# Patient Record
Sex: Male | Born: 1990 | Race: White | Hispanic: No | Marital: Single | Smoking: Never smoker
Health system: Southern US, Community
[De-identification: ages and names within clinical notes are randomized; demographics above are authoritative.]

## PROBLEM LIST (undated history)

## (undated) DIAGNOSIS — F3160 Bipolar disorder, current episode mixed, unspecified: Secondary | ICD-10-CM

## (undated) DIAGNOSIS — F32A Depression, unspecified: Secondary | ICD-10-CM

## (undated) DIAGNOSIS — G479 Sleep disorder, unspecified: Secondary | ICD-10-CM

## (undated) DIAGNOSIS — J45909 Unspecified asthma, uncomplicated: Secondary | ICD-10-CM

## (undated) DIAGNOSIS — F909 Attention-deficit hyperactivity disorder, unspecified type: Secondary | ICD-10-CM

## (undated) DIAGNOSIS — F329 Major depressive disorder, single episode, unspecified: Secondary | ICD-10-CM

## (undated) DIAGNOSIS — F419 Anxiety disorder, unspecified: Secondary | ICD-10-CM

## (undated) HISTORY — PX: WISDOM TOOTH EXTRACTION: SHX21

## (undated) HISTORY — DX: Unspecified asthma, uncomplicated: J45.909

## (undated) HISTORY — DX: Major depressive disorder, single episode, unspecified: F32.9

## (undated) HISTORY — DX: Bipolar disorder, current episode mixed, unspecified: F31.60

## (undated) HISTORY — DX: Attention-deficit hyperactivity disorder, unspecified type: F90.9

## (undated) HISTORY — DX: Depression, unspecified: F32.A

## (undated) HISTORY — DX: Sleep disorder, unspecified: G47.9

## (undated) HISTORY — DX: Anxiety disorder, unspecified: F41.9

## (undated) HISTORY — PX: ESOPHAGOGASTRODUODENOSCOPY ENDOSCOPY: SHX5814

---

## 2014-06-01 DIAGNOSIS — R350 Frequency of micturition: Secondary | ICD-10-CM | POA: Insufficient documentation

## 2016-04-03 ENCOUNTER — Encounter: Payer: Self-pay | Admitting: Family Medicine

## 2016-04-03 ENCOUNTER — Ambulatory Visit (INDEPENDENT_AMBULATORY_CARE_PROVIDER_SITE_OTHER): Payer: BLUE CROSS/BLUE SHIELD | Admitting: Family Medicine

## 2016-04-03 VITALS — BP 112/68 | HR 88 | Ht 69.25 in | Wt 155.6 lb

## 2016-04-03 DIAGNOSIS — F909 Attention-deficit hyperactivity disorder, unspecified type: Secondary | ICD-10-CM

## 2016-04-03 DIAGNOSIS — R51 Headache: Secondary | ICD-10-CM

## 2016-04-03 DIAGNOSIS — F3160 Bipolar disorder, current episode mixed, unspecified: Secondary | ICD-10-CM

## 2016-04-03 DIAGNOSIS — F908 Attention-deficit hyperactivity disorder, other type: Secondary | ICD-10-CM

## 2016-04-03 DIAGNOSIS — Z818 Family history of other mental and behavioral disorders: Secondary | ICD-10-CM

## 2016-04-03 DIAGNOSIS — G8929 Other chronic pain: Secondary | ICD-10-CM | POA: Insufficient documentation

## 2016-04-03 DIAGNOSIS — G479 Sleep disorder, unspecified: Secondary | ICD-10-CM

## 2016-04-03 DIAGNOSIS — D170 Benign lipomatous neoplasm of skin and subcutaneous tissue of head, face and neck: Secondary | ICD-10-CM

## 2016-04-03 DIAGNOSIS — R519 Headache, unspecified: Secondary | ICD-10-CM | POA: Insufficient documentation

## 2016-04-03 DIAGNOSIS — Z820 Family history of epilepsy and other diseases of the nervous system: Secondary | ICD-10-CM

## 2016-04-03 DIAGNOSIS — G44229 Chronic tension-type headache, not intractable: Secondary | ICD-10-CM

## 2016-04-03 DIAGNOSIS — Z8042 Family history of malignant neoplasm of prostate: Secondary | ICD-10-CM | POA: Insufficient documentation

## 2016-04-03 DIAGNOSIS — F900 Attention-deficit hyperactivity disorder, predominantly inattentive type: Secondary | ICD-10-CM | POA: Insufficient documentation

## 2016-04-03 HISTORY — DX: Bipolar disorder, current episode mixed, unspecified: F31.60

## 2016-04-03 HISTORY — DX: Attention-deficit hyperactivity disorder, unspecified type: F90.9

## 2016-04-03 HISTORY — DX: Sleep disorder, unspecified: G47.9

## 2016-04-03 NOTE — Assessment & Plan Note (Signed)
Being seen and treated by Dr Graylon Gunning a psychiatrist, - in [place], [place].  Told patient I'm not comfortable treating his condition with his current medication regimen and I recommend he continue with specialists.

## 2016-04-03 NOTE — Assessment & Plan Note (Signed)
Saw a Dermatologist- in [place] for his L sided Forehead lipoma.  He is opting to not excise at this time.  Told pt I am not comfortable cutting out lesion on his face, but can recommend him to another doc for this when ready

## 2016-04-03 NOTE — Assessment & Plan Note (Signed)
seroquel per Psychiatrist

## 2016-04-03 NOTE — Patient Instructions (Addendum)
please get me your medical records from your recent blood work from your other doctor and then we will determine what additional blood work may be needed if any once reviewed.           Mediterranean Diet  Why follow it? Research shows. . Those who follow the Mediterranean diet have a reduced risk of heart disease  . The diet is associated with a reduced incidence of Parkinson's and Alzheimer's diseases . People following the diet may have longer life expectancies and lower rates of chronic diseases  . The Dietary Guidelines for Americans recommends the Mediterranean diet as an eating plan to promote health and prevent disease  What Is the Mediterranean Diet?  . Healthy eating plan based on typical foods and recipes of Mediterranean-style cooking . The diet is primarily a plant based diet; these foods should make up a majority of meals   Starches - Plant based foods should make up a majority of meals - They are an important sources of vitamins, minerals, energy, antioxidants, and fiber - Choose whole grains, foods high in fiber and minimally processed items  - Typical grain sources include wheat, oats, barley, corn, brown rice, bulgar, farro, millet, polenta, couscous  - Various types of beans include chickpeas, lentils, fava beans, black beans, white beans   Fruits  Veggies - Large quantities of antioxidant rich fruits & veggies; 6 or more servings  - Vegetables can be eaten raw or lightly drizzled with oil and cooked  - Vegetables common to the traditional Mediterranean Diet include: artichokes, arugula, beets, broccoli, brussel sprouts, cabbage, carrots, celery, collard greens, cucumbers, eggplant, kale, leeks, lemons, lettuce, mushrooms, okra, onions, peas, peppers, potatoes, pumpkin, radishes, rutabaga, shallots, spinach, sweet potatoes, turnips, zucchini - Fruits common to the Mediterranean Diet include: apples, apricots, avocados, cherries, clementines, dates, figs, grapefruits,  grapes, melons, nectarines, oranges, peaches, pears, pomegranates, strawberries, tangerines  Fats - Replace butter and margarine with healthy oils, such as olive oil, canola oil, and tahini  - Limit nuts to no more than a handful a day  - Nuts include walnuts, almonds, pecans, pistachios, pine nuts  - Limit or avoid candied, honey roasted or heavily salted nuts - Olives are central to the [employer] - can be eaten whole or used in a variety of dishes   Meats Protein - Limiting red meat: no more than a few times a month - When eating red meat: choose lean cuts and keep the portion to the size of deck of cards - Eggs: approx. 0 to 4 times a week  - Fish and lean poultry: at least 2 a week  - Healthy protein sources include, chicken, [place], lean beef, lamb - Increase intake of seafood such as tuna, salmon, trout, mackerel, shrimp, scallops - Avoid or limit high fat processed meats such as sausage and bacon  Dairy - Include moderate amounts of low fat dairy products  - Focus on healthy dairy such as fat free yogurt, skim milk, low or reduced fat cheese - Limit dairy products higher in fat such as whole or 2% milk, cheese, ice cream  Alcohol - Moderate amounts of red wine is ok  - No more than 5 oz daily for women (all ages) and men older than age 59  - No more than 10 oz of wine daily for men younger than 73  Other - Limit sweets and other desserts  - Use herbs and spices instead of salt to flavor foods  - Herbs and  spices common to the traditional Mediterranean Diet include: basil, bay leaves, chives, cloves, cumin, fennel, garlic, lavender, marjoram, mint, oregano, parsley, pepper, rosemary, sage, savory, sumac, tarragon, thyme   It's not just a diet, it's a lifestyle:  . The Mediterranean diet includes lifestyle factors typical of those in the region  . Foods, drinks and meals are best eaten with others and savored . Daily physical activity is important for overall good  health . This could be strenuous exercise like running and aerobics . This could also be more leisurely activities such as walking, housework, yard-work, or taking the stairs . Moderation is the key; a balanced and healthy diet accommodates most foods and drinks . Consider portion sizes and frequency of consumption of certain foods   Meal Ideas & Options:  . Breakfast:  o Whole wheat toast or whole wheat English muffins with peanut butter & hard boiled egg o Steel cut oats topped with apples & cinnamon and skim milk  o Fresh fruit: banana, strawberries, melon, berries, peaches  o Smoothies: strawberries, bananas, greek yogurt, peanut butter o Low fat greek yogurt with blueberries and granola  o Egg white omelet with spinach and mushrooms o Breakfast couscous: whole wheat couscous, apricots, skim milk, cranberries  . Sandwiches:  o Hummus and grilled vegetables (peppers, zucchini, squash) on whole wheat bread   o Grilled chicken on whole wheat pita with lettuce, tomatoes, cucumbers or tzatziki  o Tuna salad on whole wheat bread: tuna salad made with greek yogurt, olives, red peppers, capers, green onions o Garlic rosemary lamb pita: lamb sauted with garlic, rosemary, salt & pepper; add lettuce, cucumber, greek yogurt to pita - flavor with lemon juice and black pepper  . Seafood:  o Mediterranean grilled salmon, seasoned with garlic, basil, parsley, lemon juice and black pepper o Shrimp, lemon, and spinach whole-grain pasta salad made with low fat greek yogurt  o Seared scallops with lemon orzo  o Seared tuna steaks seasoned salt, pepper, coriander topped with tomato mixture of olives, tomatoes, olive oil, minced garlic, parsley, green onions and cappers  . Meats:  o Herbed greek chicken salad with kalamata olives, cucumber, feta  o Red bell peppers stuffed with spinach, bulgur, lean ground beef (or lentils) & topped with feta   o Kebabs: skewers of chicken, tomatoes, onions, zucchini,  squash  o [place] burgers: made with red onions, mint, dill, lemon juice, feta cheese topped with roasted red peppers . Vegetarian o Cucumber salad: cucumbers, artichoke hearts, celery, red onion, feta cheese, tossed in olive oil & lemon juice  o Hummus and whole grain pita points with a greek salad (lettuce, tomato, feta, olives, cucumbers, red onion) o Lentil soup with celery, carrots made with vegetable broth, garlic, salt and pepper  o Tabouli salad: parsley, bulgur, mint, scallions, cucumbers, tomato, radishes, lemon juice, olive oil, salt and pepper.    Top Ten Foods for Health  1. Water Drink at least 8 to 12 cups of water daily. Consume half of your body weight in pounds, is the amount of water in ounces to drink daily.  Ie: a 200lb person = 100 oz water daily  2. Dark Green Vegetables Eat dark green vegetables at least three to four times a week. Good options include broccoli, peppers, brussel sprouts and leafy greens like kale and spinach.  3. Whole Grains Whole grains should be included in your diet at least two to three times daily. Look for whole wheat flour, rye, oatmeal, barley, amaranth, quinoa or  a multigrain. A good source of fiber includes 3 to 4 grams of fiber per serving. A great source has 5 or more grams of fiber per serving.  4. Beans and Lentils Try to eat a bean-based meal at least once a week. Try to add legumes, including beans and lentils, to soups, stews, casseroles, salads and dips or eat them plain.  5. Fish Try to eat two to three serving of fish a week. A serving consists of 3 to 4 ounces of cooked fish. Good choices are salmon, trout, herring, bluefish, sardines and tuna.  6. Berries Include two to four servings of fruit in your diet each day. Try to eat berries such as raspberries, blueberries, blackberries and strawberries.  7. Winter Squash Eat butternut and acorn squash as well as other richly pigmented dark orange and green colored vegetables like  sweet potato, cantaloupe and mango.  8. Soy 25 grams of soy protein a day is recommended as part of a low-fat diet to help lower cholesterol levels. Try tofu, soymilk, edamame soybeans, tempeh and texturized vegetable protein (TVP).  9. Flaxseed, Nuts and Seeds Add 1 to 2 tablespoons of ground flaxseed or other seeds to food each day or include a moderate amount of nuts - 1/4 cup - in your daily diet.  10. Organic Yogurt Men and women between 16 and 39 years of age need 1000 milligrams of calcium a day and 1200 milligrams if 85 or older. Eat calcium-rich foods such as nonfat or low-fat dairy products three to four times a day. Include organic choices.

## 2016-04-03 NOTE — Assessment & Plan Note (Addendum)
Patient getting his antipsychotics from his psychiatrist so he will just continue to get his ADHD meds from them as well.

## 2016-04-03 NOTE — Progress Notes (Signed)
New patient office visit note:  Impression and Recommendations:    Lipoma of forehead Saw a Dermatologist- in [place] for his L sided Forehead lipoma.  He is opting to not excise at this time.  Told pt I am not comfortable cutting out lesion on his face, but can recommend him to another doc for this when ready  Sleep disorder seroquel per Psychiatrist  Bipolar 1 disorder, mixed ([place]) Being seen and treated by Dr Graylon Gunning a psychiatrist, - in [place], [place].  Told patient I'm not comfortable treating his condition with his current medication regimen and I recommend he continue with specialists.  Attention deficit hyperactivity disorder (ADHD) Patient getting his antipsychotics from his psychiatrist so he will just continue to get his ADHD meds from them as well.  Pt will get me medical records from his other docs and f-up 3 months to review those and decide on further screening tests at that time.      per patient his psychiatrist just took blood work couple weeks ago and once I get that, we will determine what other blood work if any patient still needs.  Advised yrly health maintenance examination in future. We can obtain blood work at that time.  Patient's Medications  New Prescriptions   No medications on file  Previous Medications   CLONAZEPAM (KLONOPIN) 0.5 MG TABLET    Take 0.5 mg by mouth 2 (two) times daily as needed for anxiety.   LAMOTRIGINE (LAMICTAL) 100 MG TABLET    Take 150 mg by mouth daily.   QUETIAPINE (SEROQUEL) 25 MG TABLET    Take 12.5 mg by mouth at bedtime.   VYVANSE 20 MG CAPSULE    Take 20 mg by mouth every morning.  Modified Medications   No medications on file  Discontinued Medications   No medications on file    Return in about 3 months (around 07/04/2016).  The patient was counseled, risk factors were discussed, anticipatory guidance given.  Gross side effects, risk and benefits, and alternatives of medications discussed with patient.   Patient is aware that all medications have potential side effects and we are unable to predict every side effect or drug-drug interaction that may occur.  Expresses verbal understanding and consents to current therapy plan and treatment regimen.  Please see AVS handed out to patient at the end of our visit for further patient instructions/ counseling done pertaining to today's office visit.    Note: This document was prepared using Dragon voice recognition software and may include unintentional dictation errors.  ----------------------------------------------------------------------------------------------------------------------    Subjective:    Chief Complaint  Patient presents with  . Establish Care    HPI: Brent Brooks is a pleasant 25 y.o. male who presents to [place] at [facility] today to review his medical history with me and establish care.   Pt lives with Mother and step brother.   4 yr degree at [employer], minor math.  Doesn't work currently. Single.  Not sexually active- women only prior.  One cup of coffee per day. He does not exercise and a low desire to start. He has never smoked and does not drink alcohol. Denies a history of recreational drug use  Complicated psychiatric history which patient is apprehensive today about sharing with me the details of.   Denies SI/ HI.    Patient Active Problem List   Diagnosis Date Noted  . Bipolar 1 disorder, mixed ([place]) 04/03/2016  .  Sleep disorder 04/03/2016  . Lipoma of forehead 04/03/2016  . Attention deficit hyperactivity disorder (ADHD) 04/03/2016  . Family history of prostate cancer- [facility]GF 04/03/2016  . Family history of depression in [facility], [facility] 04/03/2016  . Family history of suicide [facility] 04/03/2016  . FH: migraines- multiple relatives 04/03/2016     Past Medical History:  Diagnosis Date  . Anxiety   . Asthma   . Attention deficit hyperactivity disorder (ADHD) 04/03/2016  . Bipolar 1  disorder, mixed ([place]) 04/03/2016   Dr Graylon Gunning- in [place], [place]  . Depression   . Sleep disorder 04/03/2016     Past Surgical History:  Procedure Laterality Date  . WISDOM TOOTH EXTRACTION       Family History  Problem Relation Age of Onset  . Depression Mother   . Migraines Mother   . Migraines Brother   . Suicidality Maternal Grandmother   . Migraines Maternal Grandmother   . Cancer Maternal Grandfather     prostate  . Cancer Paternal Grandfather     unknown     History  Drug Use No    History  Alcohol Use No    History  Smoking Status  . Never Smoker  Smokeless Tobacco  . Never Used    Patient's Medications  New Prescriptions   No medications on file  Previous Medications   CLONAZEPAM (KLONOPIN) 0.5 MG TABLET    Take 0.5 mg by mouth 2 (two) times daily as needed for anxiety.   LAMOTRIGINE (LAMICTAL) 100 MG TABLET    Take 150 mg by mouth daily.   QUETIAPINE (SEROQUEL) 25 MG TABLET    Take 12.5 mg by mouth at bedtime.   VYVANSE 20 MG CAPSULE    Take 20 mg by mouth every morning.  Modified Medications   No medications on file  Discontinued Medications   No medications on file    Allergies: Review of patient's allergies indicates no known allergies.  Review of Systems  Constitutional: Positive for malaise/fatigue. Negative for diaphoresis, fever and weight loss.       Patient states weakness/ tired due to poor sleep habits and he feels fatigued chronically  HENT:       Has chronic headaches.  Eyes: Negative for blurred vision, double vision, pain and discharge.  Respiratory: Negative for cough and wheezing.   Cardiovascular: Negative for chest pain and palpitations.  Gastrointestinal: Positive for diarrhea, nausea and vomiting. Negative for blood in stool.       Gets GI distress when he gets emotionally upset/ IBS type sx- chronic.   Genitourinary: Negative for dysuria, frequency and urgency.  Musculoskeletal: Negative for joint pain and myalgias.    Skin: Negative.  Negative for itching and rash.  Neurological: Positive for weakness and headaches. Negative for dizziness and focal weakness.  Endo/Heme/Allergies: Does not bruise/bleed easily.  Psychiatric/Behavioral: Positive for depression. Negative for hallucinations, memory loss, substance abuse and suicidal ideas. The patient is nervous/anxious and has insomnia.        Symptoms are chronic     Objective:   Blood pressure 125/81, pulse 88, height 5' 9.25" (1.759 m), weight 155 lb 9.6 oz (70.6 kg). Body mass index is 22.81 kg/m.  General: Well Developed, well nourished, and in no acute distress.  Neuro: Alert and oriented x3, extra-ocular muscles intact, sensation grossly intact.  HEENT: Normocephalic, atraumatic, pupils equal round reactive to light, neck supple Skin: no gross rashes, + soft, flesh-colored, freely small mass approx 4cm diam L FH over L eyebrow.  Cardiac: Regular rate and rhythm, no murmurs rubs or gallops.  Respiratory: Essentially clear to auscultation bilaterally. Not using accessory muscles, speaking in full sentences.  Abdominal: Soft, not grossly distended Musculoskeletal: Ambulates w/o diff, FROM * 4 ext.  Vasc: less 2 sec cap RF, warm and pink  Psych:  No HI/SI, judgement and insight appropriate.

## 2016-07-05 ENCOUNTER — Encounter: Payer: Self-pay | Admitting: Family Medicine

## 2016-07-05 ENCOUNTER — Ambulatory Visit (INDEPENDENT_AMBULATORY_CARE_PROVIDER_SITE_OTHER): Payer: PRIVATE HEALTH INSURANCE | Admitting: Family Medicine

## 2016-07-05 DIAGNOSIS — F430 Acute stress reaction: Secondary | ICD-10-CM

## 2016-07-05 DIAGNOSIS — R142 Eructation: Secondary | ICD-10-CM

## 2016-07-05 DIAGNOSIS — R143 Flatulence: Secondary | ICD-10-CM | POA: Diagnosis not present

## 2016-07-05 DIAGNOSIS — R110 Nausea: Secondary | ICD-10-CM | POA: Diagnosis not present

## 2016-07-05 DIAGNOSIS — R141 Gas pain: Secondary | ICD-10-CM

## 2016-07-05 MED ORDER — OMEPRAZOLE 20 MG PO CPDR: DELAYED_RELEASE_CAPSULE | ORAL | Status: DC

## 2016-07-05 MED ORDER — RANITIDINE HCL 150 MG PO TABS: 300.0000 mg | ORAL_TABLET | Freq: Two times a day (BID) | ORAL | Status: DC

## 2016-07-05 NOTE — Patient Instructions (Addendum)
Bland diet for at least 2-3 weeks and then continued to at least 5 days after symptoms completely resolve   F/up with your psychiatrist.    TREATMENT  This illness typically goes away on its own. Treatments are aimed at rehydration. The most serious cases of viral gastroenteritis involve vomiting so severely that you are not able to keep fluids down. In these cases, fluids must be given through an intravenous line (IV). HOME CARE INSTRUCTIONS   Drink enough fluids to keep your urine clear or pale yellow. Drink small amounts of fluids frequently and increase the amounts as tolerated.  Ask your caregiver for specific rehydration instructions.  Avoid:  Foods high in sugar.  Alcohol.  Carbonated drinks.  Tobacco.  Juice.  Caffeine drinks.  Extremely hot or cold fluids.  Fatty, greasy foods.  Too much intake of anything at one time.  Dairy products until 24 to 48 hours after diarrhea stops.  You may consume probiotics. Probiotics are active cultures of beneficial bacteria. They may lessen the amount and number of diarrheal stools in adults. Probiotics can be found in yogurt with active cultures and in supplements.  Wash your hands well to avoid spreading the virus.  Only take over-the-counter or prescription medicines for pain, discomfort, or fever as directed by your caregiver. Do not give aspirin to children. Antidiarrheal medicines are not recommended.  Ask your caregiver if you should continue to take your regular prescribed and over-the-counter medicines.  Keep all follow-up appointments as directed by your caregiver. SEEK IMMEDIATE MEDICAL CARE IF:   You are unable to keep fluids down.  You do not urinate at least once every 6 to 8 hours.  You develop shortness of breath.  You notice blood in your stool or vomit. This may look like coffee grounds.  You have abdominal pain that increases or is concentrated in one small area (localized).  You have persistent  vomiting or diarrhea.  You have a fever.  The patient is a child younger than 3 months, and he or she has a fever.  The patient is a child older than 3 months, and he or she has a fever and persistent symptoms.  The patient is a child older than 3 months, and he or she has a fever and symptoms suddenly get worse.  The patient is a baby, and he or she has no tears when crying. MAKE SURE YOU:   Understand these instructions.  Will watch your condition.  Will get help right away if you are not doing well or get worse.   This information is not intended to replace advice given to you by your health care provider. Make sure you discuss any questions you have with your health care provider.   Document Released: 08/21/2005 Document Revised: 11/13/2011 Document Reviewed: 06/07/2011 Elsevier Interactive Patient Education 2016 Elsevier Inc. Gastritis, Adult Gastritis is soreness and swelling (inflammation) of the lining of the stomach. Gastritis can develop as a sudden onset (acute) or long-term (chronic) condition. If gastritis is not treated, it can lead to stomach bleeding and ulcers. CAUSES  Gastritis occurs when the stomach lining is weak or damaged. Digestive juices from the stomach then inflame the weakened stomach lining. The stomach lining may be weak or damaged due to viral or bacterial infections. One common bacterial infection is the Helicobacter pylori infection. Gastritis can also result from excessive alcohol consumption, taking certain medicines, or having too much acid in the stomach.  SYMPTOMS  In some cases, there are no  symptoms. When symptoms are present, they may include:  Pain or a burning sensation in the upper abdomen.  Nausea.  Vomiting.  An uncomfortable feeling of fullness after eating. DIAGNOSIS  Your caregiver may suspect you have gastritis based on your symptoms and a physical exam. To determine the cause of your gastritis, your caregiver may perform the  following:  Blood or stool tests to check for the H pylori bacterium.  Gastroscopy. A thin, flexible tube (endoscope) is passed down the esophagus and into the stomach. The endoscope has a light and camera on the end. Your caregiver uses the endoscope to view the inside of the stomach.  Taking a tissue sample (biopsy) from the stomach to examine under a microscope. TREATMENT  Depending on the cause of your gastritis, medicines may be prescribed. If you have a bacterial infection, such as an H pylori infection, antibiotics may be given. If your gastritis is caused by too much acid in the stomach, H2 blockers or antacids may be given. Your caregiver may recommend that you stop taking aspirin, ibuprofen, or other nonsteroidal anti-inflammatory drugs (NSAIDs). HOME CARE INSTRUCTIONS  Only take over-the-counter or prescription medicines as directed by your caregiver.  If you were given antibiotic medicines, take them as directed. Finish them even if you start to feel better.  Drink enough fluids to keep your urine clear or pale yellow.  Avoid foods and drinks that make your symptoms worse, such as:  Caffeine or alcoholic drinks.  Chocolate.  Peppermint or mint flavorings.  Garlic and onions.  Spicy foods.  Citrus fruits, such as oranges, lemons, or limes.  Tomato-based foods such as sauce, chili, salsa, and pizza.  Fried and fatty foods.  Eat small, frequent meals instead of large meals. SEEK IMMEDIATE MEDICAL CARE IF:   You have black or dark red stools.  You vomit blood or material that looks like coffee grounds.  You are unable to keep fluids down.  Your abdominal pain gets worse.  You have a fever.  You do not feel better after 1 week.  You have any other questions or concerns. MAKE SURE YOU:  Understand these instructions.  Will watch your condition.  Will get help right away if you are not doing well or get worse.   This information is not intended to  replace advice given to you by your health care provider. Make sure you discuss any questions you have with your health care provider.   Document Released: 08/15/2001 Document Revised: 02/20/2012 Document Reviewed: 10/04/2011 Elsevier Interactive Patient Education [employer].

## 2016-07-05 NOTE — Progress Notes (Signed)
Impression and Recommendations:    1. Nausea without vomiting   2. Flatulence, eructation, and gas pain   3. Acute reaction to situational stress    meds and labs per below; OTC meds d/c pt as well ie. gas-ex or beano prn  Bland diet for at least 2-3 weeks and then continued to at least 5 days after symptoms completely resolve   F/up with your psychiatrist and counselor re: new emotional stressors   Orders Placed This Encounter  Procedures  . COMPLETE METABOLIC PANEL WITH GFR  . Gamma GT  . Lipase  . CBC w/Diff    New Prescriptions   OMEPRAZOLE (PRILOSEC) 20 MG CAPSULE    1 tablet twice daily for 4 weeks then 1 tab daily   RANITIDINE (ZANTAC) 150 MG TABLET    Take 2 tablets (300 mg total) by mouth 2 (two) times daily.    Modified Medications   No medications on file   CMA updated med list today--> I did not D/C any Discontinued Medications   LAMOTRIGINE (LAMICTAL) 100 MG TABLET    Take 150 mg by mouth daily.   QUETIAPINE (SEROQUEL) 25 MG TABLET    Take 12.5 mg by mouth at bedtime.   VYVANSE 20 MG CAPSULE    Take 20 mg by mouth every morning.    The patient was counseled, risk factors were discussed, anticipatory guidance given.  Gross side effects, risk and benefits, and alternatives of medications and treatment plan in general discussed with patient.  Patient is aware that all medications have potential side effects and we are unable to predict every side effect or drug-drug interaction that may occur.   Patient will call with any questions prior to using medication if they have concerns.  Expresses verbal understanding and consents to current therapy and treatment regimen.  No barriers to understanding were identified.  Red flag symptoms and signs discussed in detail.  Patient expressed understanding regarding what to do in case of emergency\urgent symptoms  Return if symptoms worsen or fail to improve for this condition.  Please see AVS handed out to patient at  the end of our visit for further patient instructions/ counseling done pertaining to today's office visit.    Note: This document was prepared using Dragon voice recognition software and may include unintentional dictation errors.   --------------------------------------------------------------------------------------------------------------------------------------------------------------------------------------------------------------------------------------------    Subjective:    CC:  Chief Complaint  Patient presents with  . Nausea    HPI: Brent Brooks is a 25 y.o. male who presents to [place] at [facility] today for issues as discussed below.   Here today b/c morning sickness- started 2 wks ago. Wakes up and feels nauseated and sick on his stomach.  A lot more gas than normal- burping and farting all the time.   Symptoms last for 3-4 hours.   7:30-8 am and N til 11 am.  Then doesn't eat til noon or so.  Nausea 8/10 intensity and never had in the past.  No vomiting, diarrhea * 1 wk and then got constipated- til couple d ago- got regular.   Doesn't recall how he got Zofran in past or under what circumstances he got it under.   Food doesn't make it W or B.    Moved in w grandparents to help them with their illnesses- about one mo ago.  GF has a "neurodegenerative dx"- lost motor fxn, ability to speak.  Not alzheimer's.   Pt's eating habits have  changed- various foods he never used to have lately as well.    Wt Readings from Last 3 Encounters:  07/05/16 160 lb 4.8 oz (72.7 kg)  04/03/16 155 lb 9.6 oz (70.6 kg)   BP Readings from Last 3 Encounters:  07/05/16 118/70  04/03/16 112/68   Pulse Readings from Last 3 Encounters:  07/05/16 92  04/03/16 88   BMI Readings from Last 3 Encounters:  07/05/16 23.50 kg/m  04/03/16 22.81 kg/m     Patient Care Team    Relationship Specialty Notifications Start End  Mellody Dance, DO PCP - General Family  Medicine  04/03/16     Patient Active Problem List   Diagnosis Date Noted  . Nausea without vomiting 07/05/2016  . Flatulence, eructation, and gas pain 07/05/2016  . Acute reaction to situational stress 07/05/2016  . Bipolar 1 disorder, mixed ([place]) 04/03/2016  . Sleep disorder 04/03/2016  . Lipoma of forehead 04/03/2016  . Attention deficit hyperactivity disorder (ADHD) 04/03/2016  . Family history of prostate cancer- MGF 04/03/2016  . Family history of depression in MOM, [facility] 04/03/2016  . Family history of suicide MGM 04/03/2016  . FH: migraines- multiple relatives 04/03/2016  . Chronic headaches 04/03/2016    Past Medical history, Surgical history, Family history, Social history, Allergies and Medications have been entered into the medical record, reviewed and changed as needed.   Allergies:  No Known Allergies  Review of Systems  Constitutional: Negative.  Negative for chills, diaphoresis, fever, malaise/fatigue and weight loss.  HENT: Negative.  Negative for congestion, sore throat and tinnitus.   Eyes: Negative.  Negative for blurred vision, double vision and photophobia.  Respiratory: Negative.  Negative for cough and wheezing.   Cardiovascular: Negative.  Negative for chest pain and palpitations.  Gastrointestinal: Positive for constipation, heartburn, nausea and vomiting. Negative for blood in stool and diarrhea.  Genitourinary: Negative.  Negative for dysuria, frequency and urgency.  Musculoskeletal: Negative.  Negative for joint pain and myalgias.  Skin: Negative.  Negative for itching and rash.  Neurological: Negative.  Negative for dizziness, focal weakness, weakness and headaches.  Endo/Heme/Allergies: Negative.  Negative for environmental allergies and polydipsia. Does not bruise/bleed easily.  Psychiatric/Behavioral: Negative.  Negative for depression and memory loss. The patient is not nervous/anxious and does not have insomnia.      Objective:   Blood pressure  118/70, pulse 92, height 5' 9.25" (1.759 m), weight 160 lb 4.8 oz (72.7 kg). Body mass index is 23.5 kg/m. General: Well Developed, well nourished, appropriate for stated age.  Neuro: Alert and oriented x3, extra-ocular muscles intact, sensation grossly intact.  HEENT: Normocephalic, atraumatic, neck supple   Skin: Warm and dry, no gross rash. Cardiac: RRR, S1 S2,  no murmurs rubs or gallops.  Respiratory: ECTA B/L, Not using accessory muscles, speaking in full sentences-unlabored. Abd:  + BS * 4, no G/R/R, no OM Vascular:  No gross lower ext edema, cap RF less 2 sec. Psych: No HI/SI, judgement and insight good, depressed mood. flat Affect.

## 2016-07-06 LAB — CBC WITH DIFFERENTIAL/PLATELET
BASOS PCT: 1 %
Basophils Absolute: 76 cells/uL (ref 0–200)
Eosinophils Absolute: 152 cells/uL (ref 15–500)
Eosinophils Relative: 2 %
HEMATOCRIT: 48.2 % (ref 38.5–50.0)
Hemoglobin: 17 g/dL (ref 13.2–17.1)
LYMPHS PCT: 39 %
Lymphs Abs: 2964 cells/uL (ref 850–3900)
MCH: 30.7 pg (ref 27.0–33.0)
MCHC: 35.3 g/dL (ref 32.0–36.0)
MCV: 87 fL (ref 80.0–100.0)
MONO ABS: 684 {cells}/uL (ref 200–950)
MONOS PCT: 9 %
MPV: 9.2 fL (ref 7.5–12.5)
NEUTROS PCT: 49 %
Neutro Abs: 3724 cells/uL (ref 1500–7800)
PLATELETS: 247 10*3/uL (ref 140–400)
RBC: 5.54 MIL/uL (ref 4.20–5.80)
RDW: 12.5 % (ref 11.0–15.0)
WBC: 7.6 10*3/uL (ref 3.8–10.8)

## 2016-07-06 LAB — COMPLETE METABOLIC PANEL WITH GFR
ALBUMIN: 4.7 g/dL (ref 3.6–5.1)
ALK PHOS: 78 U/L (ref 40–115)
ALT: 35 U/L (ref 9–46)
AST: 22 U/L (ref 10–40)
BILIRUBIN TOTAL: 0.8 mg/dL (ref 0.2–1.2)
BUN: 17 mg/dL (ref 7–25)
CO2: 25 mmol/L (ref 20–31)
Calcium: 9.7 mg/dL (ref 8.6–10.3)
Chloride: 105 mmol/L (ref 98–110)
Creat: 1.23 mg/dL (ref 0.60–1.35)
GFR, EST NON AFRICAN AMERICAN: 82 mL/min (ref >=60)
GFR, Est African American: >89 mL/min (ref >=60)
GLUCOSE: 82 mg/dL (ref 65–99)
Potassium: 4.3 mmol/L (ref 3.5–5.3)
SODIUM: 140 mmol/L (ref 135–146)
TOTAL PROTEIN: 7.3 g/dL (ref 6.1–8.1)

## 2016-07-06 LAB — GAMMA GT: GGT: 17 U/L (ref 7–51)

## 2016-07-06 LAB — LIPASE: Lipase: 42 U/L (ref 7–60)

## 2016-07-07 ENCOUNTER — Other Ambulatory Visit: Payer: PRIVATE HEALTH INSURANCE

## 2016-07-07 ENCOUNTER — Encounter: Payer: Self-pay | Admitting: Nurse Practitioner

## 2016-07-07 ENCOUNTER — Telehealth: Payer: Self-pay | Admitting: Family Medicine

## 2016-07-07 DIAGNOSIS — R110 Nausea: Secondary | ICD-10-CM

## 2016-07-07 DIAGNOSIS — R197 Diarrhea, unspecified: Secondary | ICD-10-CM

## 2016-07-07 NOTE — Telephone Encounter (Signed)
Advised pt that we will refer him to GI, per Dr. Hershal Coria result note on 07/07/16.  Order placed.  Charyl Bigger, CMA

## 2016-07-07 NOTE — Telephone Encounter (Signed)
Pt states that his nausea and vomiting is gettitng worse.  He states he woke up this morning at 5AM vomiting again.  Pt would like to know what else we can do for him.  Charyl Bigger, CMA

## 2016-07-07 NOTE — Addendum Note (Signed)
Addended by: Fonnie Mu on: 07/07/2016 01:45 PM   Modules accepted: Orders

## 2016-07-07 NOTE — Telephone Encounter (Signed)
Patient has some unspecified questions and wants someone clinical to call him (he requested to talk to Dr. Jenetta Downer). He said call him on his cell phone [phone]

## 2016-07-13 ENCOUNTER — Telehealth: Payer: Self-pay

## 2016-07-13 LAB — H. PYLORI ANTIGEN, STOOL: H. PYLORI STOOL AG, EIA: NEGATIVE

## 2016-07-13 NOTE — Telephone Encounter (Signed)
Informed pt that h. Pylori test was negative.  Pt states that he has an appointment with [facility] for 07/18/16.  Charyl Bigger, CMA

## 2016-07-14 NOTE — Progress Notes (Signed)
Pt was informed on 07/13/2016.  Charyl Bigger, CMA

## 2016-07-18 ENCOUNTER — Ambulatory Visit: Payer: PRIVATE HEALTH INSURANCE | Admitting: Nurse Practitioner

## 2016-07-21 ENCOUNTER — Ambulatory Visit: Payer: PRIVATE HEALTH INSURANCE | Admitting: Nurse Practitioner

## 2016-08-07 ENCOUNTER — Ambulatory Visit: Payer: PRIVATE HEALTH INSURANCE | Admitting: Nurse Practitioner

## 2016-09-07 ENCOUNTER — Other Ambulatory Visit: Payer: Self-pay | Admitting: Gastroenterology

## 2016-09-07 DIAGNOSIS — R112 Nausea with vomiting, unspecified: Secondary | ICD-10-CM

## 2016-09-15 ENCOUNTER — Ambulatory Visit
Admission: RE | Admit: 2016-09-15 | Discharge: 2016-09-15 | Disposition: A | Discharge status: Home / Self Care | Payer: BLUE CROSS/BLUE SHIELD | Source: Ambulatory Visit | Attending: Gastroenterology | Admitting: Gastroenterology

## 2016-09-15 DIAGNOSIS — R112 Nausea with vomiting, unspecified: Secondary | ICD-10-CM

## 2016-11-07 ENCOUNTER — Other Ambulatory Visit: Payer: PRIVATE HEALTH INSURANCE

## 2017-06-21 ENCOUNTER — Other Ambulatory Visit (HOSPITAL_COMMUNITY): Payer: Self-pay | Admitting: Gastroenterology

## 2017-06-21 DIAGNOSIS — G43A0 Cyclical vomiting, not intractable: Secondary | ICD-10-CM

## 2017-07-03 ENCOUNTER — Ambulatory Visit (HOSPITAL_COMMUNITY)
Admission: RE | Admit: 2017-07-03 | Discharge: 2017-07-03 | Disposition: A | Discharge status: Home / Self Care | Payer: BLUE CROSS/BLUE SHIELD | Source: Ambulatory Visit | Attending: Gastroenterology | Admitting: Gastroenterology

## 2017-07-03 DIAGNOSIS — R112 Nausea with vomiting, unspecified: Secondary | ICD-10-CM | POA: Insufficient documentation

## 2017-07-03 DIAGNOSIS — G43A0 Cyclical vomiting, not intractable: Secondary | ICD-10-CM

## 2017-07-03 MED ORDER — TECHNETIUM TC 99M SULFUR COLLOID
2.1500 | Freq: Once | INTRAVENOUS | Status: AC
  Administered 2017-07-03: 2.15 via ORAL

## 2017-08-07 ENCOUNTER — Ambulatory Visit: Payer: BLUE CROSS/BLUE SHIELD | Admitting: Family Medicine

## 2017-08-07 ENCOUNTER — Encounter: Payer: Self-pay | Admitting: Family Medicine

## 2017-08-07 VITALS — BP 114/77 | HR 88 | Temp 97.6°F | Ht 69.0 in | Wt 149.0 lb

## 2017-08-07 DIAGNOSIS — G479 Sleep disorder, unspecified: Secondary | ICD-10-CM

## 2017-08-07 DIAGNOSIS — M79606 Pain in leg, unspecified: Secondary | ICD-10-CM | POA: Diagnosis not present

## 2017-08-07 DIAGNOSIS — F3160 Bipolar disorder, current episode mixed, unspecified: Secondary | ICD-10-CM

## 2017-08-07 DIAGNOSIS — F430 Acute stress reaction: Secondary | ICD-10-CM | POA: Diagnosis not present

## 2017-08-07 DIAGNOSIS — M791 Myalgia, unspecified site: Secondary | ICD-10-CM | POA: Diagnosis not present

## 2017-08-07 DIAGNOSIS — F908 Attention-deficit hyperactivity disorder, other type: Secondary | ICD-10-CM

## 2017-08-07 DIAGNOSIS — R110 Nausea: Secondary | ICD-10-CM | POA: Diagnosis not present

## 2017-08-07 NOTE — Progress Notes (Signed)
Impression and Recommendations:    1. Sleep disorder   2. Nausea without vomiting   3. Bipolar 1 disorder, mixed ([place])   4. Attention deficit hyperactivity disorder (ADHD), other type   5. Acute reaction to situational stress   6. Lower extremity pain, diffuse, unspecified laterality   7. Generalized muscle ache    -Did discuss sleep strategies and how chronic stress as well as acute situational stress can cause sleep abnormalities.   -Follow-up with GI regarding his nausea\chronic GI symptoms -For his bipolar patient was on Lamictal and Seroquel in the past.  Follow-up with psychiatry in the near future sure his symptoms are well controlled - adhd- meds and txmnt per psychiatry if they feel needed -Explained to patient I am not sure what is causing his diffuse b/l lower extremity pain- esp at night.  Will obtain labs today and patient will keep a journal of the symptoms so we can see if this is related to restless leg syndrome and caffeine intake or alcohol intake etc. -Encouraged to drink adequate amounts of water per day which is one half of his weight in ounces of water per day.  Encouraged also to exercise to current AHA guidelines -We discussed use of OTC MiraLAX as needed constipation and adequate hydration along with exercise to help with BM's   Orders Placed This Encounter  Procedures  . CBC with Differential/Platelet  . Comprehensive metabolic panel  . Lipid panel  . Hemoglobin A1c  . VITAMIN D 25 Hydroxy (Vit-D Deficiency, Fractures)  . TSH  . T4, free  . Phosphorus  . Magnesium   CMA updated med list today--> I did not D/C any meds  The patient was counseled, risk factors were discussed, anticipatory guidance given.  Gross side effects, risk and benefits, and alternatives of medications and treatment plan in general discussed with patient.  Patient is aware that all medications have potential side effects and we are unable to predict every side effect or  drug-drug interaction that may occur.   Patient will call with any questions prior to using medication if they have concerns.  Expresses verbal understanding and consents to current therapy and treatment regimen.  No barriers to understanding were identified.  Red flag symptoms and signs discussed in detail.  Patient expressed understanding regarding what to do in case of emergency\urgent symptoms  Return for Follow-up every 6 months for physical exam.  Please see AVS handed out to patient at the end of our visit for further patient instructions/ counseling done pertaining to today's office visit.    Note: This document was prepared using Dragon voice recognition software and may include unintentional dictation errors.   --------------------------------------------------------------------------------------------------------------------------------------------------------------------------------------------------------------------------------------------    Subjective:    HPI: Brent Brooks is a 26 y.o. male who presents to [place] at [facility] today for issues as discussed below.  -Today OV:   - went to Dr Michail Sermon- of GI;  [place], upper EGD, gastric emptrying scan, for his "AM sickness" -had extensive workup which was negative per patient.    I do not have these records..   Per pt- told him he has no further recs.   Pt has followed diary free diet-has been feeling much improved.  He is due to get back with GI and follow-up with them.  He will call them.  - on and off pain in both legs 5-6 days, worse at night.  Random times it occurs.    Drinks 3 glasses/day.  Dull, aching and behind knee, calf, at other times in thigh.  Pt is fasting today.   - N/v improved with his change to diary -free diet and now having some constipation.     Last office visit:  Here today b/c morning sickness- started 2 wks ago. Wakes up and feels nauseated and sick on his stomach.  A lot more gas  than normal- burping and farting all the time.   Symptoms last for 3-4 hours.   7:30-8 am and N til 11 am.  Then doesn't eat til noon or so.  Nausea 8/10 intensity and never had in the past.  No vomiting, diarrhea * 1 wk and then got constipated- til couple d ago- got regular.   Doesn't recall how he got Zofran in past or under what circumstances he got it under.   Food doesn't make it W or B.    Moved in w grandparents to help them with their illnesses- about one mo ago.  GF has a "neurodegenerative dx"- lost motor fxn, ability to speak.  Not alzheimer's.   Pt's eating habits have changed- various foods he never used to have lately as well.   A/P:  meds and labs per below; OTC meds d/c pt as well ie. gas-ex or beano prn--start omeprazole and ranitidine.-  Bland diet for at least 2-3 weeks and then continued to at least 5 days after symptoms completely resolve   F/up with your psychiatrist and counselor re: new emotional stressors.    Psychiatrist in [place], [place] last seen over 1 yr ago.     Counselor- Lilyan Punt. She' shere once q wk or two.      Wt Readings from Last 3 Encounters:  08/07/17 149 lb (67.6 kg)  07/05/16 160 lb 4.8 oz (72.7 kg)  04/03/16 155 lb 9.6 oz (70.6 kg)   BP Readings from Last 3 Encounters:  08/07/17 114/77  07/05/16 118/70  04/03/16 112/68   Pulse Readings from Last 3 Encounters:  08/07/17 88  07/05/16 92  04/03/16 88   BMI Readings from Last 3 Encounters:  08/07/17 22.00 kg/m  07/05/16 23.50 kg/m  04/03/16 22.81 kg/m     Patient Care Team    Relationship Specialty Notifications Start End  Mellody Dance, DO PCP - General Family Medicine  04/03/16   Wilford Corner, MD Consulting Physician Gastroenterology  08/07/17     Patient Active Problem List   Diagnosis Date Noted  . Generalized muscle ache 08/07/2017  . Nausea without vomiting 07/05/2016  . Flatulence, eructation, and gas pain 07/05/2016  . Acute reaction to situational stress  07/05/2016  . Bipolar 1 disorder, mixed ([place]) 04/03/2016  . Sleep disorder 04/03/2016  . Lipoma of forehead 04/03/2016  . Attention deficit hyperactivity disorder (ADHD) 04/03/2016  . Family history of prostate cancer- MGF 04/03/2016  . Family history of depression in MOM, [facility] 04/03/2016  . Family history of suicide MGM 04/03/2016  . FH: migraines- multiple relatives 04/03/2016  . Chronic headaches 04/03/2016    Past Medical history, Surgical history, Family history, Social history, Allergies and Medications have been entered into the medical record, reviewed and changed as needed.   Allergies:  No Known Allergies  Review of Systems  Constitutional: Negative.  Negative for chills, diaphoresis, fever, malaise/fatigue and weight loss.  HENT: Negative.  Negative for congestion, sore throat and tinnitus.   Eyes: Negative.  Negative for blurred vision, double vision and photophobia.  Respiratory: Negative.  Negative for cough and wheezing.  Cardiovascular: Negative.  Negative for chest pain and palpitations.  Gastrointestinal: Positive for constipation, heartburn, nausea and vomiting. Negative for blood in stool and diarrhea.  Genitourinary: Negative.  Negative for dysuria, frequency and urgency.  Musculoskeletal: Negative.  Negative for joint pain and myalgias.  Skin: Negative.  Negative for itching and rash.  Neurological: Negative.  Negative for dizziness, focal weakness, weakness and headaches.  Endo/Heme/Allergies: Negative.  Negative for environmental allergies and polydipsia. Does not bruise/bleed easily.  Psychiatric/Behavioral: Negative.  Negative for depression and memory loss. The patient is not nervous/anxious and does not have insomnia.      Objective:   Blood pressure 114/77, pulse 88, temperature 97.6 F (36.4 C), height 5\' 9"  (1.753 m), weight 149 lb (67.6 kg), SpO2 99 %. Body mass index is 22 kg/m. General: Well Developed, well nourished, appropriate for stated age.   Neuro: Alert and oriented x3, extra-ocular muscles intact, sensation grossly intact.  HEENT: Normocephalic, atraumatic, neck supple   Skin: Warm and dry, no gross rash. Cardiac: RRR, S1 S2,  no murmurs rubs or gallops.  Respiratory: ECTA B/L, Not using accessory muscles, speaking in full sentences-unlabored. Abd:  + BS * 4, no G/R/R, no OM Vascular:  No gross lower ext edema, cap RF less 2 sec. Psych: No HI/SI, judgement and insight good, depressed mood. flat Affect.

## 2017-08-07 NOTE — Patient Instructions (Addendum)
Please talk with Dorothea Ogle on your way out on how to fix your name and make sure your medical records are completely updated.  -Told patient to make sure he calls GI for that follow-up office visit as instructed.  Also please try to get me medical records from your various specialists including your gastroenterologist, psychiatrist, counselor etc.  -Continue your dairy free diet until seen by gastroenterology     What is Chronic Stress Syndrome, Symptoms & Ways to Deal With it  What is Chronic Stress Syndrome?  Chronic Stress Syndrome is something which can now be called as a medical condition due to the amount of stress an individual is going through these days. Chronic Stress Syndrome causes the body and mind to shutdown and the person has no control over himself or herself. Due to the demands of modern day life and the hardship throughout day and night takes its toll over a period of time and the body and brain starts demanding rest and a break. This leads to certain symptoms where your performance level starts to dip at work, you become irritable both at work and at home, you may stop enjoying activities you previously liked, you may become depressed, you may get angry for even small things. Chronic Stress Syndrome can significantly impact your quality life. Thus it is important understand the symptoms of Chronic Stress Syndrome and react accordingly in order to cope up with it.  It is important to note here that a balanced work-home equation should be drawn to cut down symptoms of Chronic Stress Syndrome. Minor stressors can be overcome by the body's inbuilt stress response but when there is unending stress for a long period of time then an external help is required to ease the stress.  Chronic Stress Syndrome can physically and psychologically drain you over a period of time. For such cases stress management is the best way to cope up with Chronic Stress Syndrome. If Chronic Stress Syndrome is not  treated then it may result in many health hazards like anxiety, muscle pain, insomnia, and high blood pressure along with a compromised immune system leading to frequent infections and missed days from work.    What are the Symptoms of Chronic Stress Syndrome?   The symptoms of Chronic Stress Syndrome are variable and range from generalized symptoms to emotional symptoms along with behavioral and cognitive symptoms. Some of these symptoms have been delineated below:  Generalized Symptoms of Chronic Stress Syndrome are: Anxiety Depression Social isolation Headache Abdominal pain Lack of sleep Back pain Difficulty in concentrating Hypertension Hemorrhoids Varicose veins Panic attacks/ Panic disorder Cardiovascular diseases.   Some of the Emotional Symptoms of Chronic Stress Syndrome are: To become easily agitated, moody and frustrated Feeling overwhelmed which makes you feel like you are losing control. Having difficulty relaxing and have a peaceful mind Having low self esteem Feeling lonely Feeling worthless Feeling depressed Avoiding social environment.   Some of the Physical Symptoms of Chronic Stress Syndrome are: Headaches Lethargy Alternating diarrhea and constipation Nausea Muscles aches and pains Insomnia Rapid heartbeat and chest pain Infections and frequent colds Decreased libido Nervousness and shaking Tinnitus Sweaty palms Dry mouth Clenched jaw.  Some of the Cognitive Symptoms of Chronic Stress Syndrome are: Constant worrying Racing thoughts Disorganization and forgetfulness Inability to focus Poor judgment Abundance of negativity.  Some of the Behavioral Symptoms of Chronic Stress Syndrome are: Changes in appetite with less desire to eat Avoiding responsibilities Indulgence in alcohol or recreational drug use Increased nail  biting and being fidgety Ways to Deal With Chronic Stress Syndrome    Chronic Stress Syndrome is not something  which cannot be addressed. A bit of effort from your side in the form of lifestyle modifications, a little bit of exercise, a balanced work life equation can do wonders and help you get rid of Chronic Stress Syndrome.  Get Proper Sleep: It has been proved that Chronic Stress Syndrome causes loss of sleep where an individual may not even be able to sleep for days unending. This may result in the individual feeling lethargic and unable to focus at work the following morning. This may lead to decreased performance at work. Thus, it is important to have a good sleep-wake cycle. For this, try and not drink any caffeinated beverage about four hours prior to going to sleep, as caffeine pumps up the adrenaline and causes you to stay awake resulting ultimately in Chronic Stress Syndrome.  Avoid Alcohol and Drugs: Another way to get rid of Chronic Stress Syndrome is lifestyle modifications. Stay away from alcohol and other recreational drugs. Take Short Frequent Breaks at Work: Try to take frequent breaks from work and do not work continuously. Try and manage your work in such a way that you even meet your deadline and come home on time for a happy dinner with family. A good time spent with family and kids does wonders in not only dealing with Chronic Stress Syndrome but also preventing it.  Become Physically Active: Another step towards getting rid of Chronic Stress Syndrome is physical activity. If you do not have time to spend at the gym then at least try and go for daily walks for about half an hour a day which not only keeps the stress away but also is good for your overall health. Physical activity leads to production of endorphins which will make you feel relaxed and feel good.  Healthy Diet Can Help You Deal With Chronic Stress Syndrome: Have a balanced and healthy diet is another step towards a stress free life and keeping Chronic Stress Syndrome at [place]. If time is a constraint then you can try eating three  small meals a day. Try and avoid fast foods and take foods which are healthy and rich in proteins, fiber, and carbohydrates to boost your energy system.  Music Can Soothe Your Mind: Light music is one of the best and most effective relaxation techniques that one can try to overcome stress. It has shown to calm down the mind and take you away from all the stressors that you may be having. These days it is also being used as a therapy in some institutes for overcoming stress. It is important here to discuss the importance of a good social support system for patients with Chronic Stress Syndrome, as a good social support framework can do wonders in taking the stress away from the patient and overcoming Chronic Stress Syndrome.  Meditation Can Help You Deal With Chronic Stress Syndrome Effectively: Meditation and yoga has also shown to be quite effective in relaxing the mind and coping up with Chronic Stress Syndrome   In cases where these measures are not helpful, then it is time for you to consult with a skilled psychologist or a psychiatrist for potential therapies or medications to control the stress response.   The psychologist can help you with a variety of steps for coping up with Chronic Stress Syndrome. Relaxation techniques and behavioral therapy are some of the methods employed by psychologists. In some  cases, medications can also be given to help relax the patient.  Since Chronic Stress Syndrome is both emotionally and physically draining for the patient and it also adversely affects the family life of the patient hence it is important for the patient to recognize the condition and taking steps to cope up with it. Escaping measures like alcohol and drug use are of no help as they only aggravate the condition apart from their other health hazards. If this condition is ignored or left untreated it can lead to various medical conditions like anxiety and depression and various other medical  conditions.  Last but not least, smile as often as you can as it is the best gift that you can give to someone. The best way to stay relaxed is to have a good smile, exercise daily, spend time with your family, meditation and if required consultation with a good psychologist so that you can live a stress free life and overcome the symptoms of Chronic Stress Syndrome.

## 2017-08-08 DIAGNOSIS — F329 Major depressive disorder, single episode, unspecified: Secondary | ICD-10-CM | POA: Insufficient documentation

## 2017-08-08 DIAGNOSIS — F32A Depression, unspecified: Secondary | ICD-10-CM | POA: Insufficient documentation

## 2017-08-08 DIAGNOSIS — F419 Anxiety disorder, unspecified: Secondary | ICD-10-CM | POA: Insufficient documentation

## 2017-08-08 DIAGNOSIS — K5900 Constipation, unspecified: Secondary | ICD-10-CM | POA: Insufficient documentation

## 2017-08-08 DIAGNOSIS — J45909 Unspecified asthma, uncomplicated: Secondary | ICD-10-CM | POA: Insufficient documentation

## 2017-08-08 LAB — CBC WITH DIFFERENTIAL/PLATELET
BASOS: 2 %
Basophils Absolute: 0.1 10*3/uL (ref 0.0–0.2)
EOS (ABSOLUTE): 0.1 10*3/uL (ref 0.0–0.4)
EOS: 2 %
HEMATOCRIT: 47.6 % (ref 37.5–51.0)
HEMOGLOBIN: 16.9 g/dL (ref 13.0–17.7)
Immature Grans (Abs): 0 10*3/uL (ref 0.0–0.1)
Immature Granulocytes: 0 %
LYMPHS ABS: 2.2 10*3/uL (ref 0.7–3.1)
Lymphs: 41 %
MCH: 31.8 pg (ref 26.6–33.0)
MCHC: 35.5 g/dL (ref 31.5–35.7)
MCV: 90 fL (ref 79–97)
MONOCYTES: 9 %
MONOS ABS: 0.5 10*3/uL (ref 0.1–0.9)
NEUTROS ABS: 2.5 10*3/uL (ref 1.4–7.0)
Neutrophils: 46 %
Platelets: 219 10*3/uL (ref 150–379)
RBC: 5.32 x10E6/uL (ref 4.14–5.80)
RDW: 12.9 % (ref 12.3–15.4)
WBC: 5.3 10*3/uL (ref 3.4–10.8)

## 2017-08-08 LAB — COMPREHENSIVE METABOLIC PANEL
A/G RATIO: 1.8 (ref 1.2–2.2)
ALBUMIN: 4.9 g/dL (ref 3.5–5.5)
ALK PHOS: 72 IU/L (ref 39–117)
ALT: 16 IU/L (ref 0–44)
AST: 18 IU/L (ref 0–40)
BILIRUBIN TOTAL: 0.8 mg/dL (ref 0.0–1.2)
BUN / CREAT RATIO: 11 (ref 9–20)
BUN: 12 mg/dL (ref 6–20)
CO2: 23 mmol/L (ref 20–29)
CREATININE: 1.09 mg/dL (ref 0.76–1.27)
Calcium: 9.6 mg/dL (ref 8.7–10.2)
Chloride: 103 mmol/L (ref 96–106)
GFR calc Af Amer: 109 mL/min/{1.73_m2} (ref >59)
GFR calc non Af Amer: 94 mL/min/{1.73_m2} (ref >59)
GLOBULIN, TOTAL: 2.8 g/dL (ref 1.5–4.5)
Glucose: 80 mg/dL (ref 65–99)
POTASSIUM: 3.9 mmol/L (ref 3.5–5.2)
SODIUM: 140 mmol/L (ref 134–144)
Total Protein: 7.7 g/dL (ref 6.0–8.5)

## 2017-08-08 LAB — PHOSPHORUS: PHOSPHORUS: 4.1 mg/dL (ref 2.5–4.5)

## 2017-08-08 LAB — LIPID PANEL
CHOL/HDL RATIO: 5 ratio (ref 0.0–5.0)
CHOLESTEROL TOTAL: 176 mg/dL (ref 100–199)
HDL: 35 mg/dL — ABNORMAL LOW (ref >39)
LDL CALC: 124 mg/dL — AB (ref 0–99)
Triglycerides: 87 mg/dL (ref 0–149)
VLDL Cholesterol Cal: 17 mg/dL (ref 5–40)

## 2017-08-08 LAB — MAGNESIUM: Magnesium: 2 mg/dL (ref 1.6–2.3)

## 2017-08-08 LAB — T4, FREE: FREE T4: 1.66 ng/dL (ref 0.82–1.77)

## 2017-08-08 LAB — TSH: TSH: 0.995 u[IU]/mL (ref 0.450–4.500)

## 2017-08-08 LAB — VITAMIN D 25 HYDROXY (VIT D DEFICIENCY, FRACTURES): VIT D 25 HYDROXY: 16 ng/mL — AB (ref 30.0–100.0)

## 2017-08-08 LAB — HEMOGLOBIN A1C
ESTIMATED AVERAGE GLUCOSE: 88 mg/dL
Hgb A1c MFr Bld: 4.7 % — ABNORMAL LOW (ref 4.8–5.6)

## 2017-08-09 ENCOUNTER — Other Ambulatory Visit: Payer: Self-pay

## 2017-08-09 DIAGNOSIS — E559 Vitamin D deficiency, unspecified: Secondary | ICD-10-CM | POA: Insufficient documentation

## 2017-08-09 MED ORDER — VITAMIN D (ERGOCALCIFEROL) 1.25 MG (50000 UNIT) PO CAPS: 50000.0000 [IU] | ORAL_CAPSULE | ORAL | 3 refills | Status: DC

## 2017-08-10 ENCOUNTER — Encounter: Payer: Self-pay | Admitting: Family Medicine

## 2017-08-15 LAB — LIPASE: LIPASE: 35 U/L (ref 13–78)

## 2017-08-15 LAB — SPECIMEN STATUS REPORT

## 2018-02-08 ENCOUNTER — Ambulatory Visit: Payer: BLUE CROSS/BLUE SHIELD | Admitting: Family Medicine

## 2018-03-13 ENCOUNTER — Ambulatory Visit (INDEPENDENT_AMBULATORY_CARE_PROVIDER_SITE_OTHER): Payer: Self-pay | Admitting: Family Medicine

## 2018-03-13 ENCOUNTER — Encounter: Payer: Self-pay | Admitting: Family Medicine

## 2018-03-13 VITALS — BP 123/81 | HR 101 | Ht 65.0 in | Wt 143.0 lb

## 2018-03-13 DIAGNOSIS — R7309 Other abnormal glucose: Secondary | ICD-10-CM

## 2018-03-13 DIAGNOSIS — E785 Hyperlipidemia, unspecified: Secondary | ICD-10-CM

## 2018-03-13 DIAGNOSIS — E162 Hypoglycemia, unspecified: Secondary | ICD-10-CM | POA: Insufficient documentation

## 2018-03-13 DIAGNOSIS — E559 Vitamin D deficiency, unspecified: Secondary | ICD-10-CM

## 2018-03-13 DIAGNOSIS — E786 Lipoprotein deficiency: Secondary | ICD-10-CM

## 2018-03-13 DIAGNOSIS — M791 Myalgia, unspecified site: Secondary | ICD-10-CM

## 2018-03-13 DIAGNOSIS — Z711 Person with feared health complaint in whom no diagnosis is made: Secondary | ICD-10-CM

## 2018-03-13 DIAGNOSIS — F4520 Hypochondriacal disorder, unspecified: Secondary | ICD-10-CM

## 2018-03-13 NOTE — Progress Notes (Signed)
Pt here for an acute care OV today   Impression and Recommendations:    1. Person with feared complaint, no diagnosis made   2. Hypochondriacal disorder, unspecified   3. Hyperlipidemia, unspecified hyperlipidemia type   4. Low HDL (under 40)   5. Vitamin D deficiency   6. Low glucose level/  Low A1c at 4.7   7. Generalized muscle ache     1. Skin Concerns - Advised patient not to be concerned about the fibrous tissue he palpated on his arms.  Educated patient that fibrous tissue of this nature will likely never go away.  Emphasized the importance of coming in for concerns like this, to assess the need for medical concern or not.  2. Vitamin D Deficiency - Patient's muscle aches have improved since taking Vitamin D supplementation. - Continue supplementation as prescribed.  3. Low A1c - Encouraged patient to eat three meals a day to minimize chance of side-effects due to low blood glucose.  4. Hyperlipidemia - Dietary changes such as low saturated & trans fat and low carb/ ketogenic diets discussed with patient.  Encouraged regular exercise.  5. BMI Counseling - Encouraged patient to eat three meals a day, especially given his low A1c.    Explained to patient what BMI refers to, and what it means medically.    Told patient to think about it as a "medical risk stratification measurement" and how increasing BMI is associated with increasing risk/ or worsening state of various diseases such as hypertension, hyperlipidemia, diabetes, premature OA, depression etc.  American Heart Association guidelines for healthy diet, basically Mediterranean diet, and exercise guidelines of 30 minutes 5 days per week or more discussed in detail.  Health counseling performed.  All questions answered.  6. Lifestyle & Preventative Health Maintenance - Advised patient to continue working toward exercising to improve overall mental, physical, and emotional health.    - Engage in 30 minutes of  physical activity daily.  Recommended that the patient eventually strive for at least 150 minutes of moderate cardiovascular activity per week according to guidelines established by the [facility].   - Healthy dietary habits encouraged, including low-carb, and high amounts of lean protein in diet.   - Patient should also consume adequate amounts of water - half of body weight in oz of water per day.  7. Follow-Up - Encouraged patient to come in for chronic follow-up in 6 months.  - Patient is fasting today.  Lab work drawn today for cholesterol, A1c, and Vitamin D.  No orders of the defined types were placed in this encounter.   Orders Placed This Encounter  Procedures  . Hemoglobin A1c  . Lipid panel  . VITAMIN D 25 Hydroxy (Vit-D Deficiency, Fractures)     Education and routine counseling performed. Handouts provided  Gross side effects, risk and benefits, and alternatives of medications and treatment plan in general discussed with patient.  Patient is aware that all medications have potential side effects and we are unable to predict every side effect or drug-drug interaction that may occur.   Patient will call with any questions prior to using medication if they have concerns.  Expresses verbal understanding and consents to current therapy and treatment regimen.  No barriers to understanding were identified.  Red flag symptoms and signs discussed in detail.  Patient expressed understanding regarding what to do in case of emergency\urgent symptoms   Please see AVS handed out to patient at the end of our visit for further  patient instructions/ counseling done pertaining to today's office visit.   Return for Follow-up 6 months for complete physical exam; sooner as needed.     Note: This document was prepared occasionally using Dragon voice recognition software and may include unintentional dictation errors in addition to a scribe.  This document serves as a record of services personally  performed by Mellody Dance, DO. It was created on her behalf by Toni Amend, a trained medical scribe. The creation of this record is based on the scribe's personal observations and the provider's statements to them.   I have reviewed the above medical documentation for accuracy and completeness and I concur.  Mellody Dance 03/13/18 5:21 PM   ------------------------------------------------------------------------------------------------------------------------------------   Subjective:    CC:  Chief Complaint  Patient presents with  . Rash    HPI: Brent Brooks is a 27 y.o. male who presents to [place] at [facility] today for issues as discussed below.  Skin Concerns - "Lumps on Right Arm" Patient is here today because he felt some lumps on and under his right arm.  States "I'm a hypochondriac and want to make sure that I don't have cancer."  Patient first noticed these bumps a couple of weeks ago.  They are not painful.  Patient notes he was was palpating his arm and bicep and felt them.  He cannot tell if they've changed or grown.  Notes that they've basically stayed the same.  Readily admits that he is mostly here due to paranoia.  Suspected Lipoma on Forehead - Followed-up with Dermatology He did follow-up with a dermatologist in [place] for the suspected lipoma on his forehead.  States that she told him it was not a lipoma, but was also nothing to worry about.  Vitamin D Deficiency & Hyperlipidemia In December 2018, patient was diagnosed with Vitamin D Deficiency and high cholesterol.  States that he has been taking the Vitamin D consistently but there is one prescription he hasn't been taking.  He also began doing cardiovascular exercise every day, but has "fallen off the wagon" recently.  Since starting Vitamin D, his muscle aches have subsided.  Low A1c Patient states that he eats about two meals per day, but denies side-effects with  this.  Physical Activity Patient states that it takes him 8.5 minutes to run a mile.  He does this every other day.  Psychiatric Care Patient notes that he's never known any medication to actually help with his psychiatric concerns.  Now his psychiatrist won't accept his insurance, so he has not returned to psychiatric care for some time.  Notes that he's "still pretty much" where he was when he left.   Problem  Hyperlipidemia  Low glucose level/  Low A1c at 4.7  Low Hdl (Under 40)  Vitamin D Deficiency  Person With Feared Complaint, No Diagnosis Made  Hypochondriacal Disorder, Unspecified  Anxiety  Asthma  Constipation  Depressive Disorder  Attention Deficit Hyperactivity Disorder, Predominantly Inattentive Type     Wt Readings from Last 3 Encounters:  03/13/18 143 lb (64.9 kg)  08/07/17 149 lb (67.6 kg)  07/05/16 160 lb 4.8 oz (72.7 kg)   BP Readings from Last 3 Encounters:  03/13/18 123/81  08/07/17 114/77  07/05/16 118/70   BMI Readings from Last 3 Encounters:  03/13/18 23.80 kg/m  08/07/17 22.00 kg/m  07/05/16 23.50 kg/m     Patient Care Team    Relationship Specialty Notifications Start End  Mellody Dance, DO PCP - General  Family Medicine  04/03/16   Wilford Corner, MD Consulting Physician Gastroenterology  08/07/17      Patient Active Problem List   Diagnosis Date Noted  . Hyperlipidemia 03/13/2018    Priority: High  . Low glucose level/  Low A1c at 4.7 03/13/2018    Priority: Medium  . Low HDL (under 40) 03/13/2018    Priority: Medium  . Vitamin D deficiency 08/09/2017    Priority: Low  . Person with feared complaint, no diagnosis made 03/13/2018  . 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 Anxiety 08/08/2017  . Asthma 08/08/2017  . Constipation 08/08/2017  . Depressive disorder 08/08/2017  . Generalized muscle ache 08/07/2017  . Nausea without vomiting 07/05/2016  . Flatulence, eructation, and gas pain 07/05/2016  . Acute  reaction to situational stress 07/05/2016  . Bipolar 1 disorder, mixed ([place]) 04/03/2016  . Sleep disorder 04/03/2016  . Lipoma of forehead 04/03/2016  . 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 Family history of prostate cancer- MGF 04/03/2016  . Family history of depression in MOM, [facility] 04/03/2016  . Family history of suicide MGM 04/03/2016  . FH: migraines- multiple relatives 04/03/2016  . Chronic headaches 04/03/2016    Past Medical history, Surgical history, Family history, Social history, Allergies and Medications have been entered into the medical record, reviewed and changed as needed.    Current Meds  Medication Sig  . clonazePAM (KLONOPIN) 0.5 MG tablet Take 0.5 mg by mouth 2 (two) times daily as needed for anxiety.  . ondansetron (ZOFRAN) 4 MG tablet Take 4 mg by mouth every 8 (eight) hours as needed for nausea or vomiting.  . Vitamin D, Ergocalciferol, (DRISDOL) 50000 units CAPS capsule Take 1 capsule (50,000 Units total) by mouth every 7 (seven) days.    Allergies:  No Known Allergies   Review of Systems: General:   Denies fever, chills, unexplained weight loss.  Optho/Auditory:   Denies visual changes, blurred vision/LOV Respiratory:   Denies wheeze, DOE more than baseline levels.  Cardiovascular:   Denies chest pain, palpitations, new onset peripheral edema  Gastrointestinal:   Denies nausea, vomiting, diarrhea, abd pain.  Genitourinary: Denies dysuria, freq/ urgency, flank pain or discharge from genitals.  Endocrine:     Denies hot or cold intolerance, polyuria, polydipsia. Musculoskeletal:   Denies unexplained myalgias, joint swelling, unexplained arthralgias, gait problems.  Skin:  Denies new onset rash, suspicious lesions Neurological:     Denies dizziness, unexplained weakness, numbness  Psychiatric/Behavioral:   Denies mood changes, suicidal or homicidal ideations, hallucinations    Objective:   Blood pressure  123/81, pulse (!) 101, height 5\' 5"  (1.651 m), weight 143 lb (64.9 kg), SpO2 97 %. Body mass index is 23.8 kg/m.   General: Well Developed, well nourished, and in no acute distress.  HEENT: Normocephalic, atraumatic Skin: No abnormality of skin appreciated in areas of concern.  Warm and dry, cap RF less 2 sec, good turgor. Chest:  Normal excursion, shape, no gross abn Respiratory: speaking in full sentences, no conversational dyspnea NeuroM-Sk: Ambulates w/o assistance, moves * 4 Psych: A and O *3, insight good, mood-full

## 2018-03-13 NOTE — Patient Instructions (Signed)

## 2018-03-14 LAB — HEMOGLOBIN A1C
ESTIMATED AVERAGE GLUCOSE: 91 mg/dL
HEMOGLOBIN A1C: 4.8 % (ref 4.8–5.6)

## 2018-03-14 LAB — LIPID PANEL
CHOLESTEROL TOTAL: 180 mg/dL (ref 100–199)
Chol/HDL Ratio: 4.7 ratio (ref 0.0–5.0)
HDL: 38 mg/dL — ABNORMAL LOW (ref >39)
LDL Calculated: 119 mg/dL — ABNORMAL HIGH (ref 0–99)
TRIGLYCERIDES: 114 mg/dL (ref 0–149)
VLDL Cholesterol Cal: 23 mg/dL (ref 5–40)

## 2018-03-14 LAB — VITAMIN D 25 HYDROXY (VIT D DEFICIENCY, FRACTURES): Vit D, 25-Hydroxy: 57.2 ng/mL (ref 30.0–100.0)

## 2018-08-15 ENCOUNTER — Ambulatory Visit: Payer: Self-pay | Admitting: Psychology

## 2018-09-11 ENCOUNTER — Encounter: Payer: Self-pay | Admitting: Family Medicine

## 2018-09-11 ENCOUNTER — Ambulatory Visit (INDEPENDENT_AMBULATORY_CARE_PROVIDER_SITE_OTHER): Payer: PRIVATE HEALTH INSURANCE | Admitting: Family Medicine

## 2018-09-11 VITALS — BP 121/80 | HR 101 | Temp 98.2°F | Ht 65.0 in | Wt 139.3 lb

## 2018-09-11 DIAGNOSIS — R110 Nausea: Secondary | ICD-10-CM

## 2018-09-11 DIAGNOSIS — Z719 Counseling, unspecified: Secondary | ICD-10-CM

## 2018-09-11 DIAGNOSIS — E559 Vitamin D deficiency, unspecified: Secondary | ICD-10-CM | POA: Diagnosis not present

## 2018-09-11 DIAGNOSIS — Z0000 Encounter for general adult medical examination without abnormal findings: Secondary | ICD-10-CM | POA: Diagnosis not present

## 2018-09-11 DIAGNOSIS — F430 Acute stress reaction: Secondary | ICD-10-CM

## 2018-09-11 MED ORDER — VITAMIN D (ERGOCALCIFEROL) 1.25 MG (50000 UNIT) PO CAPS: 50000.0000 [IU] | ORAL_CAPSULE | ORAL | 3 refills | Status: DC

## 2018-09-11 MED ORDER — ONDANSETRON HCL 4 MG PO TABS: 4.0000 mg | ORAL_TABLET | ORAL | 0 refills | Status: AC | PRN

## 2018-09-11 NOTE — Patient Instructions (Addendum)
Preventive Care 18-39 Years, Male Preventive care refers to lifestyle choices and visits with your health care provider that can promote health and wellness. What does preventive care include?   A yearly physical exam. This is also called an annual well check.  Dental exams once or twice a year.  Routine eye exams. Ask your health care provider how often you should have your eyes checked.  Personal lifestyle choices, including: ? Daily care of your teeth and gums. ? Regular physical activity. ? Eating a healthy diet. ? Avoiding tobacco and drug use. ? Limiting alcohol use. ? Practicing safe sex. What happens during an annual well check? The services and screenings done by your health care provider during your annual well check will depend on your age, overall health, lifestyle risk factors, and family history of disease. Counseling Your health care provider may ask you questions about your:  Alcohol use.  Tobacco use.  Drug use.  Emotional well-being.  Home and relationship well-being.  Sexual activity.  Eating habits.  Work and work Statistician. Screening You may have the following tests or measurements:  Height, weight, and BMI.  Blood pressure.  Lipid and cholesterol levels. These may be checked every 5 years starting at age 16.  Diabetes screening. This is done by checking your blood sugar (glucose) after you have not eaten for a while (fasting).  Skin check.  Hepatitis C blood test.  Hepatitis B blood test.  Sexually transmitted disease (STD) testing. Discuss your test results, treatment options, and if necessary, the need for more tests with your health care provider. Vaccines Your health care provider may recommend certain vaccines, such as:  Influenza vaccine. This is recommended every year.  Tetanus, diphtheria, and acellular pertussis (Tdap, Td) vaccine. You may need a Td booster every 10 years.  Varicella vaccine. You may need this if you  have not been vaccinated.  HPV vaccine. If you are 6 or younger, you may need three doses over 6 months.  Measles, mumps, and rubella (MMR) vaccine. You may need at least one dose of MMR.You may also need a second dose.  Pneumococcal 13-valent conjugate (PCV13) vaccine. You may need this if you have certain conditions and have not been vaccinated.  Pneumococcal polysaccharide (PPSV23) vaccine. You may need one or two doses if you smoke cigarettes or if you have certain conditions.  Meningococcal vaccine. One dose is recommended if you are age 18-21 years and a first-year college student living in a residence hall, or if you have one of several medical conditions. You may also need additional booster doses.  Hepatitis A vaccine. You may need this if you have certain conditions or if you travel or work in places where you may be exposed to hepatitis A.  Hepatitis B vaccine. You may need this if you have certain conditions or if you travel or work in places where you may be exposed to hepatitis B.  Haemophilus influenzae type b (Hib) vaccine. You may need this if you have certain risk factors. Talk to your health care provider about which screenings and vaccines you need and how often you need them. This information is not intended to replace advice given to you by your health care provider. Make sure you discuss any questions you have with your health care provider. Document Released: 10/17/2001 Document Revised: 04/03/2017 Document Reviewed: 06/22/2015 Elsevier Interactive Patient Education  2019 [facility] Referrals    Anderson Malta, personal counselor  in [place], specializing in marriage counseling    Dr. Tomi Bamberger, PHD Dr. Tomi Bamberger, PHD is a counselor in [place], [place].  [address] [place], [place] [address] [place] [phone]   Kristie Cowman, [place]  33  [phone] [email or web address] ( she does [place] and faith-based coaching and counseling )    [employer]- ( faith-based counseling ) Address: [address]. [place], [place] [address] [phone] [address] for appointments [phone] Fax Hours: Monday - Thursday 8:00am-6:00pm Closed for lunch 12-1Thursday only Friday: Closed all day   Steffanie Rainwater: [phone] or Meg [phone] -counselors in [place] who are faith based   -Also Ms. Marya Fossa - [place] behavioral medicine. Darrick Meigs based counseling.    [facility] Nunzio Cobbs, LCSW, ACSW, M.ED.  -Nunzio Cobbs is a licensed clinical social worker in practice over 35 years and with Dr. Chucky May for the last 10 years.  -She sees adults, adolescents, children & families and couples. -Services are provided for mood and anxiety disorders, marital issues, family or parent/child problems, parenting, co-dependency, gender issues, trauma, grief, and stages of life issues. She also provides critical incident stress debriefing.  -Pamala Hurry accepts many employee assistance programs (EAP), [employer], Pharmacist, hospital.  PHONE  [phone]                FAX [phone]   Rodena Goldmann [email or web address] [employer]- gen counseling;  PHD   Wilber Oliphant, MSW [address] [place]   [place] Apolonio Schneiders, PhD [address] [facility] [phone]   [place] and Psychological- children [address], [place], [place]    [facility]- various counselors [place], [place] [phone] ( one patient saw Gayland Curry and really liked her)    Lanelle Bal Professional Counselor Counseling and [employer] [phone]   [place] abuse [facility] Taylor-Clinical Manager [address], [place] [phone] [place] [address] [place] Delmer Islam, PhD **   -adult, adolescent, and child Oneida Arenas, PhD Leitha Bleak, LCSW Jillene Bucks, PhD-child, adolescent and adults   [facility] [address], [place] [phone] Merilyn Baba, MS-child, adolescent and adults Lennart Pall, PhD-adolescent and adults   [facility]-grief, terminal illness [place], [place] [address], [place] [facility] [address] [place], [place]   [facility] [address] [place], [place] [phone]   [facility] [address], [place] [phone]   [facility] [address], [place]   [facility] [address], [place] [address], [place]

## 2018-09-11 NOTE — Progress Notes (Signed)
Male physical  Impression and Recommendations:    1. Encounter for wellness examination   2. Health education/counseling   3. Vitamin D deficiency   4. Nausea without vomiting   5. Acute reaction to situational stress     - Continue to follow up with specialists as established for related management.  - Patient knows he may return to the clinic for further evaluation of\ acute or chronic concerns at any time.  Encouraged patient to re-establish with Neurology and to obtain a counselor for assistance with any future anxiety concerns.  1) Anticipatory Guidance: Discussed importance of wearing a seatbelt while driving, not texting while driving;   sunscreen when outside along with skin surveillance; eating a balanced and modest diet; physical activity at least 25 minutes per day or 150 min/ week moderate to intense activity.  - Encouraged use of a solution of half rubbing alcohol half hydrogen peroxide to clean cerumen in his ears.  - Discussed prudent practice of self-testicular exams once monthly.  2) Immunizations / Screenings / Labs: All immunizations are up-to-date per recommendations or will be updated today. Patient is due for dental and vision screens which pt will schedule independently. Will obtain CBC, CMP, HgA1c, Lipid panel, TSH and vit D when fasting, if not already done recently.   - Flu shot recently obtained.  - Prudent sexual health and sexual health screening habits recommended to patient today. - Discussed updated routine screening before each new sexual partner.  - Need for Gardasil vaccine.  Information provided to patient today.  3) Lifestyle & Preventative Health Maintenance - Advised patient to continue working toward exercising to improve overall mental, physical, and emotional health.    - Recommended improved nutrient density of diet through increasing intake of fruits and vegetables and decreasing saturated fats, white flour products and refined  sugars.   - American Heart Association guidelines for healthy diet, basically Mediterranean diet, and exercise guidelines of 30 minutes 5 days per week or more discussed in detail.  - Health counseling performed.  All questions answered.  - Reviewed the "spokes of the wheel" of mood and health management.  Stressed the importance of ongoing prudent habits, including regular exercise, appropriate sleep hygiene, healthful dietary habits, and prayer/meditation to relax.  - Encouraged patient to engage in daily physical activity, especially a formal exercise routine.  Recommended that the patient eventually strive for at least 150 minutes of moderate cardiovascular activity per week according to guidelines established by the [facility].   - Healthy dietary habits encouraged, including low-carb, and high amounts of lean protein in diet.   - Patient should also consume adequate amounts of water.    Meds ordered this encounter  Medications  . Vitamin D, Ergocalciferol, (DRISDOL) 1.25 MG (50000 UT) CAPS capsule    Sig: Take 1 capsule (50,000 Units total) by mouth every 7 (seven) days.    Dispense:  12 capsule    Refill:  3  . ondansetron (ZOFRAN) 4 MG tablet    Sig: Take 1 tablet (4 mg total) by mouth as needed for nausea or vomiting.    Dispense:  30 tablet    Refill:  0    Gross side effects, risk and benefits, and alternatives of medications discussed with patient.  Patient is aware that all medications have potential side effects and we are unable to predict every side effect or drug-drug interaction that may occur.  Expresses verbal understanding and consents to current therapy plan and treatment regimen.  Please see AVS handed out to patient at the end of our visit for further patient instructions/ counseling done pertaining to today's office visit.  Follow-up preventative CPE in 1 year. Follow-up office visit pending lab work.  F/up sooner for chronic care management and/or prn   I have  reviewed the above medical documentation for accuracy and completeness that was typed by Toni Amend and I concur.  Mellody Dance, DO 09/11/2018 9:52 PM        Subjective:    CC: CPE  HPI: Varnell Orvis is a 28 y.o. male who presents to [place] at [facility] today for a yearly health maintenance exam.    Health Maintenance Summary Reviewed and updated, unless pt declines services.  Tobacco History Reviewed:  Never smoker. Alcohol:  No concerns, no excessive use Exercise Habits:  Not meeting AHA guidelines.  Notes he exercise for a while "but fell off that horse." STD concerns:  No concerns.   Drug Use:  None. Birth control method:  Not currently sexually active. Testicular/penile concerns:  No concerns reported. No sexual partner currently.  Has had intercourse with three women, but no testing afterward.  States that he has no sexual concerns about any of his previous girlfriends.  Vitamin D Supplementation Continues taking Vitamin D.  GI Concerns - Chronic Nausea Patient continues having issues with nausea.  He uses Zofran to treat his nausea. Follow up with Dr. Michail Sermon for his GI concerns.  Visual Health Wears glasses and goes for regular eye exams.  Sleep Habits States he gets to sleep fine, but his grandmother wakes up early.  States he has always been able to "wake up at a pin drop."  Lives with his grandmother.  Lifestyle Works in Neurosurgeon.  States that he is currently having trouble getting interviews for jobs.  Specialty Care Formerly followed up with Neurology.  States he no longer follows up with any other specialists aside from GI.  Chronic Anxiety - Managed in the past by Neurology Patient is concerned that his anxiety might be causing his hair loss.  Feels that his anxiety has gotten worse in recent times.  Notes that his main issue is with his anxiety "spiralling," because once his mind "latches onto something," it's  difficult to let go.    Immunization History  Administered Date(s) Administered  . DTaP 06/06/1993, 09/22/1993, 03/05/1995, 01/14/1998  . Hepatitis A 09/24/2009, 08/31/2010  . Hepatitis B Dec [birth year], 1992, 06/06/1993, 05/02/1994  . HiB (PRP-OMP) 06/06/1993, 09/22/1993, 05/02/1994  . IPV 06/06/1993, 09/22/1993, 05/02/1994, 01/14/1998  . Influenza,inj,Quad PF,6+ Mos 09/01/2017  . Influenza-Unspecified 06/10/2016, 09/01/2017  . MMR 05/02/1994, 01/14/1998  . Meningococcal Conjugate 09/24/2009  . Tdap 09/24/2009    Health Maintenance  Topic Date Due  . INFLUENZA VACCINE  06/05/2019 (Originally 04/04/2018)  . HIV Screening  09/12/2019 (Originally 01/26/2008)  . TETANUS/TDAP  09/25/2019      Wt Readings from Last 3 Encounters:  09/11/18 139 lb 4.8 oz (63.2 kg)  03/13/18 143 lb (64.9 kg)  08/07/17 149 lb (67.6 kg)   BP Readings from Last 3 Encounters:  09/11/18 121/80  03/13/18 123/81  08/07/17 114/77   Pulse Readings from Last 3 Encounters:  09/11/18 (!) 101  03/13/18 (!) 101  08/07/17 88    Patient Active Problem List   Diagnosis Date Noted  . Hyperlipidemia 03/13/2018    Priority: High  . Low glucose level/  Low A1c at 4.7 03/13/2018    Priority: Medium  . Low HDL (  under 40) 03/13/2018    Priority: Medium  . Vitamin D deficiency 08/09/2017    Priority: Low  . Person with feared complaint, no diagnosis made 03/13/2018  . 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 Anxiety 08/08/2017  . Asthma 08/08/2017  . Constipation 08/08/2017  . Depressive disorder 08/08/2017  . Generalized muscle ache 08/07/2017  . Nausea without vomiting 07/05/2016  . Flatulence, eructation, and gas pain 07/05/2016  . Acute reaction to situational stress 07/05/2016  . Bipolar 1 disorder, mixed ([place]) 04/03/2016  . Sleep disorder 04/03/2016  . Lipoma of forehead 04/03/2016  . 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 Family history of prostate  cancer- MGF 04/03/2016  . Family history of depression in MOM, [facility] 04/03/2016  . Family history of suicide MGM 04/03/2016  . FH: migraines- multiple relatives 04/03/2016  . Chronic headaches 04/03/2016    Past Medical History:  Diagnosis Date  . Anxiety   . Asthma   . Attention deficit hyperactivity disorder (ADHD) 04/03/2016  . Bipolar 1 disorder, mixed ([place]) 04/03/2016   Dr Graylon Gunning- in [place], [place]  . Depression   . Sleep disorder 04/03/2016    Past Surgical History:  Procedure Laterality Date  . WISDOM TOOTH EXTRACTION      Family History  Problem Relation Age of Onset  . Depression Mother   . Migraines Mother   . Migraines Brother   . Suicidality Maternal Grandmother   . Migraines Maternal Grandmother   . Cancer Maternal Grandfather        prostate  . Cancer Paternal Grandfather        unknown    Social History   Substance and Sexual Activity  Drug Use No  ,  Social History   Substance and Sexual Activity  Alcohol Use No  ,  Social History   Tobacco Use  Smoking Status Never Smoker  Smokeless Tobacco Never Used  ,  Social History   Substance and Sexual Activity  Sexual Activity Not Currently  . Birth control/protection: Condom    Patient's Medications  New Prescriptions   No medications on file  Previous Medications   CLONAZEPAM (KLONOPIN) 0.5 MG TABLET    Take 0.5 mg by mouth 2 (two) times daily as needed for anxiety.  Modified Medications   Modified Medication Previous Medication   ONDANSETRON (ZOFRAN) 4 MG TABLET ondansetron (ZOFRAN) 4 MG tablet      Take 1 tablet (4 mg total) by mouth as needed for nausea or vomiting.    Take 4 mg by mouth every 8 (eight) hours as needed for nausea or vomiting.   VITAMIN D, ERGOCALCIFEROL, (DRISDOL) 1.25 MG (50000 UT) CAPS CAPSULE Vitamin D, Ergocalciferol, (DRISDOL) 50000 units CAPS capsule      Take 1 capsule (50,000 Units total) by mouth every 7 (seven) days.    Take 1 capsule (50,000 Units total) by mouth  every 7 (seven) days.  Discontinued Medications   No medications on file    Patient has no known allergies.  Review of Systems: General:   Denies fever, chills, unexplained weight loss.  Optho/Auditory:   Denies visual changes, blurred vision/LOV Respiratory:   Denies SOB, DOE more than baseline levels.  Cardiovascular:   Denies chest pain, palpitations, new onset peripheral edema  Gastrointestinal:   Denies nausea, vomiting, diarrhea.  Genitourinary: Denies dysuria, freq/ urgency, flank pain or discharge from genitals.  Endocrine:     Denies hot or cold intolerance, polyuria, polydipsia. Musculoskeletal:   Denies unexplained myalgias,  joint swelling, unexplained arthralgias, gait problems.  Skin:  Denies rash, suspicious lesions Neurological:     Denies dizziness, unexplained weakness, numbness  Psychiatric/Behavioral:   Denies mood changes, suicidal or homicidal ideations, hallucinations    Objective:     Blood pressure 121/80, pulse (!) 101, temperature 98.2 F (36.8 C), height 5' 5"  (1.651 m), weight 139 lb 4.8 oz (63.2 kg), SpO2 99 %. Body mass index is 23.18 kg/m. General Appearance:    Alert, cooperative, no distress, appears stated age  Head:    Normocephalic, without obvious abnormality, atraumatic  Eyes:    PERRL, conjunctiva/corneas clear, EOM's intact, fundi    benign, both eyes  Ears:    Normal TM's and external ear canals, both ears  Nose:   Nares normal, septum midline, mucosa normal, no drainage    or sinus tenderness  Throat:   Lips w/o lesion, mucosa moist, and tongue normal; teeth and   gums normal  Neck:   Supple, symmetrical, trachea midline, no adenopathy;    thyroid:  no enlargement/tenderness/nodules; no carotid   bruit or JVD  Back:     Symmetric, no curvature, ROM normal, no CVA tenderness  Lungs:     Clear to auscultation bilaterally, respirations unlabored, no       Wh/ R/ R  Chest Wall:    No tenderness or gross deformity; normal excursion    Heart:    Regular rate and rhythm, S1 and S2 normal, no murmur, rub   or gallop  Abdomen:     Soft, non-tender, bowel sounds active all four quadrants, NO   G/R/R, no masses, no organomegaly  Genitalia:    Ext genitalia: without lesion, no penile rash or discharge, no hernias appreciated   Rectal:    Deferred.  Extremities:   Extremities normal, atraumatic, no cyanosis or gross edema  Pulses:   2+ and symmetric all extremities  Skin:   Warm, dry, Skin color, texture, turgor normal, no obvious rashes or lesions  M-Sk:   Ambulates * 4 w/o difficulty, no gross deformities, tone WNL  Neurologic:   CNII-XII intact, normal strength, sensation and reflexes    Throughout Psych:  No HI/SI, judgement and insight good, Euthymic mood. Full Affect.

## 2018-09-18 ENCOUNTER — Ambulatory Visit (INDEPENDENT_AMBULATORY_CARE_PROVIDER_SITE_OTHER): Payer: PRIVATE HEALTH INSURANCE | Admitting: Psychology

## 2018-09-18 DIAGNOSIS — F410 Panic disorder [episodic paroxysmal anxiety] without agoraphobia: Secondary | ICD-10-CM

## 2018-09-26 ENCOUNTER — Ambulatory Visit (INDEPENDENT_AMBULATORY_CARE_PROVIDER_SITE_OTHER): Payer: PRIVATE HEALTH INSURANCE | Admitting: Psychology

## 2018-09-26 DIAGNOSIS — F4310 Post-traumatic stress disorder, unspecified: Secondary | ICD-10-CM

## 2018-10-01 ENCOUNTER — Ambulatory Visit (INDEPENDENT_AMBULATORY_CARE_PROVIDER_SITE_OTHER): Payer: PRIVATE HEALTH INSURANCE | Admitting: Psychology

## 2018-10-01 DIAGNOSIS — F4310 Post-traumatic stress disorder, unspecified: Secondary | ICD-10-CM | POA: Diagnosis not present

## 2018-10-08 ENCOUNTER — Encounter: Payer: Self-pay | Admitting: Family Medicine

## 2018-10-08 ENCOUNTER — Ambulatory Visit (INDEPENDENT_AMBULATORY_CARE_PROVIDER_SITE_OTHER): Payer: PRIVATE HEALTH INSURANCE | Admitting: Family Medicine

## 2018-10-08 VITALS — BP 121/69 | HR 99 | Temp 98.5°F | Ht 65.0 in | Wt 143.0 lb

## 2018-10-08 DIAGNOSIS — F909 Attention-deficit hyperactivity disorder, unspecified type: Secondary | ICD-10-CM | POA: Diagnosis not present

## 2018-10-08 MED ORDER — LISDEXAMFETAMINE DIMESYLATE 20 MG PO CAPS: 20.0000 mg | ORAL_CAPSULE | ORAL | 0 refills | Status: DC

## 2018-10-08 NOTE — Patient Instructions (Addendum)
[name] we need to obtain patient's medical records from patient's psychiatrist in [place]- Dr Graylon Gunning- who recently saw him for this condition.   He was evaluated back in 2018 and started on Vyvanse and we just need these documents for the record.     Lisdexamfetamine Oral Capsule  What is this medicine? LISDEXAMFETAMINE (lis DEX am fet a meen) is used to treat attention-deficit hyperactivity disorder (ADHD) in adults and children. It is also used to treat binge-eating disorder in adults. Federal law prohibits giving this medicine to any person other than the person for whom it was prescribed. Do not share this medicine with anyone else. This medicine may be used for other purposes; ask your health care provider or pharmacist if you have questions. COMMON BRAND NAME(S): Vyvanse What should I tell my health care provider before I take this medicine? They need to know if you have any of these conditions: -anxiety or panic attacks -circulation problems in fingers and toes -glaucoma -hardening or blockages of the arteries or heart blood vessels -heart disease or a heart defect -high blood pressure -history of a drug or alcohol abuse problem -history of stroke -kidney disease -liver disease -mental illness -seizures -suicidal thoughts, plans, or attempt; a previous suicide attempt by you or a family member -thyroid disease -Tourette's syndrome -an unusual or allergic reaction to lisdexamfetamine, other medicines, foods, dyes, or preservatives -pregnant or trying to get pregnant -breast-feeding How should I use this medicine? Take this medicine by mouth. Follow the directions on the prescription label. Swallow the capsules with a drink of water. You may open capsule and add to a glass of water, then drink right away. Take your doses at regular intervals. Do not take your medicine more often than directed. Do not suddenly stop your medicine. You must gradually reduce the dose or you may feel  withdrawal effects. Ask your doctor or health care professional for advice. A special MedGuide will be given to you by the pharmacist with each prescription and refill. Be sure to read this information carefully each time. Talk to your pediatrician regarding the use of this medicine in children. While this drug may be prescribed for children as young as 7 years of age for selected conditions, precautions do apply. Overdosage: If you think you have taken too much of this medicine contact a poison control center or emergency room at once. NOTE: This medicine is only for you. Do not share this medicine with others. What if I miss a dose? If you miss a dose, take it as soon as you can. If it is almost time for your next dose, take only that dose. Do not take double or extra doses. What may interact with this medicine? Do not take this medicine with any of the following medications: -MAOIs like Carbex, Eldepryl, Marplan, Nardil, and Parnate -other stimulant medicines for attention disorders, weight loss, or to stay awake This medicine may also interact with the following medications: -acetazolamide -ammonium chloride -antacids -ascorbic acid -atomoxetine -caffeine -certain medicines for blood pressure -certain medicines for depression, anxiety, or psychotic disturbances -certain medicines for seizures like carbamazepine, phenobarbital, phenytoin -certain medicines for stomach problems like cimetidine, famotidine, omeprazole, lansoprazole -cold or allergy medicines -green tea -levodopa -linezolid -medicines for sleep during surgery -methenamine -norepinephrine -phenothiazines like chlorpromazine, mesoridazine, prochlorperazine, thioridazine -propoxyphene -sodium acid phosphate -sodium bicarbonate This list may not describe all possible interactions. Give your health care provider a list of all the medicines, herbs, non-prescription drugs, or dietary supplements you use. Also  tell them if  you smoke, drink alcohol, or use illegal drugs. Some items may interact with your medicine. What should I watch for while using this medicine? Visit your doctor for regular check ups. This prescription requires that you follow special procedures with your doctor and pharmacy. You will need to have a new written prescription from your doctor every time you need a refill. This medicine may affect your concentration, or hide signs of tiredness. Until you know how this medicine affects you, do not drive, ride a bicycle, use machinery, or do anything that needs mental alertness. Tell your doctor or health care professional if this medicine loses its effects, or if you feel you need to take more than the prescribed amount. Do not change your dose without talking to your doctor or health care professional. Decreased appetite is a common side effect when starting this medicine. Eating small, frequent meals or snacks can help. Talk to your doctor if you continue to have poor eating habits. Height and weight growth of a child taking this medicine will be monitored closely. Do not take this medicine close to bedtime. It may prevent you from sleeping. If you are going to need surgery, a MRI, CT scan, or other procedure, tell your doctor that you are taking this medicine. You may need to stop taking this medicine before the procedure. Tell your doctor or healthcare professional right away if you notice unexplained wounds on your fingers and toes while taking this medicine. You should also tell your healthcare provider if you experience numbness or pain, changes in the skin color, or sensitivity to temperature in your fingers or toes. What side effects may I notice from receiving this medicine? Side effects that you should report to your doctor or health care professional as soon as possible: -allergic reactions like skin rash, itching or hives, swelling of the face, lips, or tongue -changes in vision -chest pain or  chest tightness -confusion, trouble speaking or understanding -fast, irregular heartbeat -fingers or toes feel numb, cool, painful -hallucination, loss of contact with reality -high blood pressure -males: prolonged or painful erection -seizures -severe headaches -shortness of breath -suicidal thoughts or other mood changes -trouble walking, dizziness, loss of balance or coordination -uncontrollable head, mouth, neck, arm, or leg movements Side effects that usually do not require medical attention (report to your doctor or health care professional if they continue or are bothersome): -anxious -headache -loss of appetite -nausea, vomiting -trouble sleeping -weight loss This list may not describe all possible side effects. Call your doctor for medical advice about side effects. You may report side effects to FDA at 1-800-FDA-1088. Where should I keep my medicine? Keep out of the reach of children. This medicine can be abused. Keep your medicine in a safe place to protect it from theft. Do not share this medicine with anyone. Selling or giving away this medicine is dangerous and against the law. Store at room temperature between 15 and 30 degrees C (59 and 86 degrees F). Protect from light. Keep container tightly closed. Throw away any unused medicine after the expiration date. NOTE: This sheet is a summary. It may not cover all possible information. If you have questions about this medicine, talk to your doctor, pharmacist, or health care provider.  2019 Elsevier/Gold Standard (2014-06-24 19:20:14)    For more information you can go to:  FastfoodLife.com.cy  http://www.chadd.org/    Also if you would like to be assessed by specialists and ADD\ADHD:  [facility] [address], [place], [place] [address] Phone: [phone] Fax: [phone]  Hours of Operation Monday to Friday 8:00 AM - 5:00  PM Saturday and Sunday: Closed      For Parents of Children With ADHD  General Tips: 1. Rules should be clear and brief. Your child should know exactly what you expect from him/her. 2. Give your child chores. This will give him/her a sense of responsibility and boost self-esteem. 3. Short lists of tasks are excellent to help a child remember. 4. Routines are extremely important for children with ADHD. Set up regular times for meals, homework, TV, getting up, and going to bed. Follow through on the schedule! 5. Identify what your child is good at doing (like art, math, computer skills) and build on it. 6. Tell your child that your love and support him/her unconditionally. 7. Catch your child being good and give immediate positive feedback.   Common Daily Problems It is very hard to get my child ready for school in the morning.  Create a consistent and predictable schedule for rising and getting ready in the morning.  Set up a routine so that your child can predict the order of events. Put this routine in writing or in pictures on a poster for your child. Schedule example: Alarm goes off => Brush teeth => Wash face => Get dressed => Eat breakfast =>  Take medication => Get on school bus  Reward and praise your child! This will motivate your child to succeed. Even if your child does not succeed in all parts of the "morning routine", use praise to reward your child when he/she is successful. Progress is often made in a series of small steps!  If your child is on medication, try waking your child up 30 to 45 minutes before the usual wake time and give him/her the medication immediately. Then allow your child to "rest" in bed for the next 30 minutes. This rest period will allow the medication to begin working and your child will be better able to participate in the morning routine.   My child is very irritable in the late afternoon/early evening. (Common side effect of stimulant  medications)  The late afternoon and evening is often a very stressful time for all children in all families because parents and children have had to "hold it all together" at work and at school.  If your child is on medication, you child may also be experiencing "rebound" - the time when your child's medication is not wearing off during a time of "high demand" (for example, when homework or chores are usually being done).  Create a period of "downtime" when your child can do calm activities like listen to music, take a bath, read, etc.  Alternatively, let your child "blow off extra energy and tension" by doing some physical exercise.  Talk to your child's doctor about giving your child a smaller dose of medication in the late afternoon. This is called a "stepped down" dose and helps a child transition off of medication in the evening.   My child is losing weight or not eating enough. (Common side effects of stimulant medication use)  Encourage breakfast with calorie-dense foods.  Give the morning dose of medication after your child has already eaten breakfast. Afternoon doses should also be given after lunch.  Provide your child with nutritious after-school and bedtime snacks that are high in protein and in complex carbohydrates. Examples: Nutrition/proten bars, shakes/drinks made with protein powder, liquid  meals.  Get eating started with any highly preferred food before giving other foods.  Consider shifting dinner to a time later in the evening when your child's medication has worn off. Alternatively, allow your child to "graze" in the evening on healthy snacks, as he or she may be hungriest right before bed.  Follow your child's height and weight with careful measurements at your child's doctor's office and talk to your child's doctor.   Homework Tips  Establish a routine and schedule for homework (a specific time and place.) Don't allow your child to wait until the evening to get  started.  Limit distractions in the home during homework hours (reducing unnecessary noise, activity, and phone calls, and turning off the tv).  Praise and compliment your child when he/she puts forth good effort and completes tasks. In a supportive, non critical manner, it is appropriate and helpful to assist in pointing out and making some corrections of errors on the homework.  It is not your responsibility to correct all of your child's errors on homework or make him/her complete and turn in a perfect paper.  Remind your child to do homework and offer incentives: "When you finish your homework, you can watch TV or play a game."  If your child struggles with reading, help by reading the material together or reading it to your son/daughter.  Work a certain amount of time and then stop working on homework.  Many parents find it very difficult to help their own child with schoolwork. Find someone who can. Consider hiring a tutor! Often a junior or senior high school student is ideal, depending on the need and age of your child.   Discipline  Be firm. Set rules and keep to them.  Make sure your child understands the rules, so he/she does not feel uninformed.  Use positive reinforcement. Praise and reward your child for good behavior.  Change or rotate rewards frequently to maintain a high interest level.  Punish behavior, not the child. If your child misbehaves, try alternatives like allowing natural consequences, withdrawing yourself from the conflict, or giving your child a choice.   Taking Care of Yourself  Come to terms with your child's challenges and strengths.  Seek support from family and friends or professional help such as counseling or support groups.  Help other family members recognize and understand ADHD.

## 2018-10-08 NOTE — Progress Notes (Signed)
ADHD/ ADD OV note  Impression and Recommendations:    1. Attention deficit hyperactivity disorder (ADHD), unspecified ADHD type       Told Brent Brooks my CMA we need to obtain patient's medical records from patient's psychiatrist in [place]- Dr Graylon Gunning- who recently saw him for this condition.   He was evaluated back in 2018 and started on Vyvanse and we just need these documents for the record.   1. ADHD Management - History of Official Diagnosis in 2018 - Patient was treated on Vyvanse in the past. - Begin low-dose Vyvanse. - Patient knows to communicate about his progress and S-E on medication.  - Reviewed risks and benefits of beginning treatment on Vyvanse. - Medication profile and side effects discussed. - Within two weeks, advised patient that he should be used to the medication.  - Discussed recommendations to referral to follow-up with psychiatry.  - Encouraged patient to seek self-help books for adults with ADHD.  - Discussed diet, including magnesium, fish oil, and avoiding carbohydrate loading. Importance of daily exercise stressed.  - Reviewed the "spokes of the wheel" of mood and health management, including attention deficit management.  Stressed the importance of ongoing prudent habits, including regular exercise, appropriate sleep hygiene, healthful dietary habits, and prayer/meditation to relax.  - Health counseling performed.  All questions answered.    Education and routine counseling performed. Handouts provided if pt desired.   Return for ADD/ ADHD recheck every 64mo.   -Reminded patient the need for yearly complete physical exam office visits in addition to office visits for management of the chronic diseases  -Gross side effects, risk and benefits, and alternatives of medications discussed with patient.  Patient is aware that all medications have potential side effects and we are unable to predict every side effect or drug-drug interaction that may  occur.  Expresses verbal understanding and consents to current therapy plan and treatment regimen.   Please see AVS handed out to patient at the end of our visit for further patient instructions/ counseling done pertaining to today's office visit.    Note:  This document was prepared using Dragon voice recognition software and may include unintentional dictation errors.  This document serves as a record of services personally performed by Mellody Dance, DO. It was created on her behalf by Toni Amend, a trained medical scribe. The creation of this record is based on the scribe's personal observations and the provider's statements to them.   I have reviewed the above medical documentation for accuracy and completeness and I concur.  Mellody Dance, DO 10/08/2018 5:03 PM      ______________________________________________________________________    Subjective:  HPI: Brent Brooks y.o. male  presents for 3 month follow up for evaluation of our treatment plan for pt's ADD/ ADHD.  Filled out a questionnaire today.  Has no issues relaxing while at home alone.  Often forgets his appointments.  Generally has a horrible time getting himself to do anything, but when he gets to organizing or helping someone else fix a problem, he has no issue dedicating "hours upon hours to that with virtually no rest."  Notes "once something latches on to my perfectionism, I dive into it."  States he was diagnosed with ADHD when young, but stopped taking medication sometime in the 5th grade.  Was re-diagnosed in 2017-2018.    States he obtained his diagnosis after he had been in college for a few years, and had never done any work at all.  Says he had been in therapy for a while at that point "but still wasn't really solving anything."  Obtained his diagnosis after being evaluated in [place], [place], by a psychiatrist, Dr. Luther Parody.  Was prescribed and taking Vyvanse in the past, but cannot  remember his dose.  Believes he tolerated Vyvanse well at the time.  He isn't sure, however, if it helped him accomplish tasks.   "I didn't really believe in my own diagnosis at that point; I was really just grasping for straws."  Patient states he felt like the treatment wasn't changing anything, and had insurance issues, and stopped.  Lately he feels he's been connecting with the diagnosis more, "actually seeing how the symptoms reflect in my life."  He currently feels like it's been a challenge to do work on his own.  "If I try to do something, generally, I feel like my mind's slipping off it.  Like if I have something in a Word document on the computer I need to edit, I'll have it open, but it's like I'm not even looking at it."  Often trying to start about even thinking to do something puts his mind in a haze.  "Whenever I get near it, it just starts draining all my energy and I avoid it all the time."    Problem  Adult Adhd     Wt Readings from Last 3 Encounters:  10/08/18 143 lb (64.9 kg)  09/11/18 139 lb 4.8 oz (63.2 kg)  03/13/18 143 lb (64.9 kg)    BMI Readings from Last 3 Encounters:  10/08/18 23.80 kg/m  09/11/18 23.18 kg/m  03/13/18 23.80 kg/m    BP Readings from Last 3 Encounters:  10/08/18 121/69  09/11/18 121/80  03/13/18 123/81     Review of Systems: General:   No F/C, wt loss Pulm:   No DIB, SOB, pleuritic chest pain Card:  No CP, palpitations Abd:  No n/v/d or pain Ext:  No inc edema from baseline   Objective: Physical Exam: BP 121/69   Pulse 99   Temp 98.5 F (36.9 C)   Ht 5\' 5"  (1.651 m)   Wt 143 lb (64.9 kg)   SpO2 99%   BMI 23.80 kg/m  Body mass index is 23.8 kg/m. General: Well nourished, in no apparent distress. Eyes: PERRLA, EOMs, conjunctiva clr no swelling or erythema Neck: supple Resp: Respiratory effort- normal, ECTA B/L w/o W/R/R  Cardio: RRR w/o MRGs. Skin: Warm, dry without rashes, lesions, ecchymosis.  Neuro: Alert,  Oriented Psych: Normal affect, Insight and Judgment appropriate.    Current Medications:  Current Outpatient Medications on File Prior to Visit  Medication Sig Dispense Refill  . clonazePAM (KLONOPIN) 0.5 MG tablet Take 0.5 mg by mouth 2 (two) times daily as needed for anxiety.    . ondansetron (ZOFRAN) 4 MG tablet Take 1 tablet (4 mg total) by mouth as needed for nausea or vomiting. 30 tablet 0  . Vitamin D, Ergocalciferol, (DRISDOL) 1.25 MG (50000 UT) CAPS capsule Take 1 capsule (50,000 Units total) by mouth every 7 (seven) days. 12 capsule 3   No current facility-administered medications on file prior to visit.     Medical History:  Patient Active Problem List   Diagnosis Date Noted  . Hyperlipidemia 03/13/2018    Priority: High  . Low glucose level/  Low A1c at 4.7 03/13/2018    Priority: Medium  . Low HDL (under 40) 03/13/2018    Priority: Medium  . Vitamin D deficiency  08/09/2017    Priority: Low  . Adult ADHD 10/08/2018  . Person with feared complaint, no diagnosis made 03/13/2018  . 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 Anxiety 08/08/2017  . Asthma 08/08/2017  . Constipation 08/08/2017  . Depressive disorder 08/08/2017  . Generalized muscle ache 08/07/2017  . Nausea without vomiting 07/05/2016  . Flatulence, eructation, and gas pain 07/05/2016  . Acute reaction to situational stress 07/05/2016  . Bipolar 1 disorder, mixed ([place]) 04/03/2016  . Sleep disorder 04/03/2016  . Lipoma of forehead 04/03/2016  . 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 Family history of prostate cancer- MGF 04/03/2016  . Family history of depression in MOM, [facility] 04/03/2016  . Family history of suicide MGM 04/03/2016  . FH: migraines- multiple relatives 04/03/2016  . Chronic headaches 04/03/2016    Allergies:  No Known Allergies   Family history-  Reviewed; changed as appropriate  Social history-  Reviewed; changed as  appropriate

## 2018-10-10 ENCOUNTER — Ambulatory Visit: Payer: PRIVATE HEALTH INSURANCE | Admitting: Psychology

## 2018-10-10 ENCOUNTER — Ambulatory Visit: Payer: Self-pay | Admitting: Family Medicine

## 2018-10-10 ENCOUNTER — Encounter: Payer: Self-pay | Admitting: Family Medicine

## 2018-10-17 ENCOUNTER — Ambulatory Visit (INDEPENDENT_AMBULATORY_CARE_PROVIDER_SITE_OTHER): Payer: PRIVATE HEALTH INSURANCE | Admitting: Psychology

## 2018-10-17 DIAGNOSIS — F410 Panic disorder [episodic paroxysmal anxiety] without agoraphobia: Secondary | ICD-10-CM

## 2018-10-18 ENCOUNTER — Telehealth: Payer: Self-pay

## 2018-10-18 NOTE — Telephone Encounter (Signed)
I recommend pt call insurance and find out what medication they cover /  What med is preferred.   Then, we can provide him with a top tier med that will be covered

## 2018-10-18 NOTE — Telephone Encounter (Signed)
Insurance prefers Adderall XR. MPulliam, CMA/RT(R)

## 2018-10-18 NOTE — Telephone Encounter (Signed)
Pharmacy notified that Vyvanse needs a PA through insurance.  PA was denied for medication.  Please review and advise of any medication changes needed. MPulliam, CMA/RT(R)

## 2018-10-21 ENCOUNTER — Other Ambulatory Visit: Payer: Self-pay | Admitting: Family Medicine

## 2018-10-21 DIAGNOSIS — F909 Attention-deficit hyperactivity disorder, unspecified type: Secondary | ICD-10-CM

## 2018-10-21 MED ORDER — AMPHETAMINE-DEXTROAMPHET ER 30 MG PO CP24: 30.0000 mg | ORAL_CAPSULE | ORAL | 0 refills | Status: DC

## 2018-10-21 NOTE — Telephone Encounter (Signed)
See other telephone note.  

## 2018-10-21 NOTE — Telephone Encounter (Signed)
D/c vyvanse  Start adderall XL 20mg  I po qd Disp 90  NO RF

## 2018-10-21 NOTE — Addendum Note (Signed)
Addended by: Narda Rutherford on: 10/21/2018 02:56 PM   Modules accepted: Orders

## 2018-10-21 NOTE — Telephone Encounter (Signed)
Patient walked into office asking for an update on Adderall prescription since his Vyvanse was denied, also he is requesting a psych referral to [facility]. Place referral in appropriate and advise Dorothea Ogle when this is done (as he cannot see these class of referrals in his WQ)

## 2018-10-21 NOTE — Telephone Encounter (Signed)
Routed to provider for Adderall prescription.

## 2018-10-21 NOTE — Telephone Encounter (Signed)
adderall sent to pharm on file and vyvanse D/ced on med list. Please tell pt we need to hear from him on how this med is working prior to giving RF's and he should know Q 3 mo f/ups

## 2018-10-22 ENCOUNTER — Ambulatory Visit (INDEPENDENT_AMBULATORY_CARE_PROVIDER_SITE_OTHER): Payer: PRIVATE HEALTH INSURANCE | Admitting: Psychology

## 2018-10-22 DIAGNOSIS — F410 Panic disorder [episodic paroxysmal anxiety] without agoraphobia: Secondary | ICD-10-CM | POA: Diagnosis not present

## 2018-10-22 NOTE — Telephone Encounter (Signed)
Left message advising of recommendations.  

## 2018-10-28 ENCOUNTER — Telehealth: Payer: Self-pay | Admitting: Family Medicine

## 2018-10-28 NOTE — Telephone Encounter (Signed)
Patient called states he thinks he needs the Vyvanse, says Adderall is causing panic attack, loss of appetite,nervousness  Pt request change of medication from :  amphetamine-dextroamphetamine (ADDERALL XR) 30 MG 24 hr capsule [[id number]]   Order Details  Dose: 30 mg Route: Oral Frequency: BH-each morning  Dispense Quantity: 30 capsule Refills: 0 Fills remaining: --        Sig: Take 1 capsule (30 mg total) by mouth every morning.          --- Forwarding request to medical assistant for review with provider & if approved please send Vyvanse Rx to:  [id number] [address], [place] [phone] (Phone) [phone] (Fax)   ---glh

## 2018-10-29 NOTE — Telephone Encounter (Signed)
Patient came by the office, I explained to him that I would have to send in another PA to the insurance for the Vyvanse and that it can take 24-72 hours for a response.  Patient states that he has 4-5 days of Vyvanse left.    Patient states that he took 5 days of the Adderall and it caused extreme anxiety, panic, terror, and increased heart rate.  Patient states that yesterday was the first day with out medication and he noticed depression symptoms: suicidal thoughts and decreased sleep.  I advised the patient that for acute depression symptoms that include self harm thoughts the best thing is for him to go to [facility] for assessment. I provided patient with clinic's crisis packet that includes Behavioral Health contact information along with 24 hour suicide hotline. Patient expressed understanding of the importance of seeking help early into these symptoms.

## 2018-10-31 ENCOUNTER — Other Ambulatory Visit: Payer: Self-pay

## 2018-10-31 DIAGNOSIS — G479 Sleep disorder, unspecified: Secondary | ICD-10-CM

## 2018-10-31 DIAGNOSIS — F900 Attention-deficit hyperactivity disorder, predominantly inattentive type: Secondary | ICD-10-CM

## 2018-10-31 DIAGNOSIS — F3160 Bipolar disorder, current episode mixed, unspecified: Secondary | ICD-10-CM

## 2018-10-31 DIAGNOSIS — F419 Anxiety disorder, unspecified: Secondary | ICD-10-CM

## 2018-10-31 DIAGNOSIS — F909 Attention-deficit hyperactivity disorder, unspecified type: Secondary | ICD-10-CM

## 2018-10-31 NOTE — Telephone Encounter (Signed)
Spoke to Dr. Raliegh Scarlet about the symptoms that the patient experienced on the Adderall and the symptoms that he has being off of the medications. She feels at this time since his reaction to medication and then his response to stopping medication that this would be best managed by psychiatric. Referral has been placed but this can taking time for the patient and Dr. Raliegh Scarlet feels that this is a very time sensitive case.  Patient has a pyshcology appointment tomorrow and I will reach out to them to see if they can help to get referral for psychiatric care moved along quicker due to nature of this case.

## 2018-11-01 ENCOUNTER — Ambulatory Visit (INDEPENDENT_AMBULATORY_CARE_PROVIDER_SITE_OTHER): Payer: PRIVATE HEALTH INSURANCE | Admitting: Psychology

## 2018-11-01 DIAGNOSIS — F410 Panic disorder [episodic paroxysmal anxiety] without agoraphobia: Secondary | ICD-10-CM

## 2018-11-05 ENCOUNTER — Ambulatory Visit: Payer: Self-pay | Admitting: Family Medicine

## 2018-11-07 ENCOUNTER — Ambulatory Visit (INDEPENDENT_AMBULATORY_CARE_PROVIDER_SITE_OTHER): Payer: PRIVATE HEALTH INSURANCE | Admitting: Family Medicine

## 2018-11-07 ENCOUNTER — Encounter: Payer: Self-pay | Admitting: Family Medicine

## 2018-11-07 VITALS — BP 118/74 | HR 94 | Temp 98.2°F | Ht 65.0 in | Wt 145.0 lb

## 2018-11-07 DIAGNOSIS — F900 Attention-deficit hyperactivity disorder, predominantly inattentive type: Secondary | ICD-10-CM | POA: Diagnosis not present

## 2018-11-07 DIAGNOSIS — G479 Sleep disorder, unspecified: Secondary | ICD-10-CM

## 2018-11-07 DIAGNOSIS — Z719 Counseling, unspecified: Secondary | ICD-10-CM | POA: Diagnosis not present

## 2018-11-07 DIAGNOSIS — F3160 Bipolar disorder, current episode mixed, unspecified: Secondary | ICD-10-CM | POA: Diagnosis not present

## 2018-11-07 MED ORDER — LISDEXAMFETAMINE DIMESYLATE 20 MG PO CAPS: 20.0000 mg | ORAL_CAPSULE | Freq: Every day | ORAL | 0 refills | Status: DC

## 2018-11-07 NOTE — Patient Instructions (Signed)
Future Meds for ADHD per Psychiatrist/ doc you are seeing in near future.

## 2018-11-07 NOTE — Progress Notes (Signed)
ADHD/ ADD OV note  Impression and Recommendations:    1. Attention deficit hyperactivity disorder, predominantly inattentive type   2. Bipolar 1 disorder, mixed ([place])   3. Sleep disorder   4. Health education/counseling     1. ADHD, Bipolar 1 Management - Establishing with Psychiatry - Patient has an appointment scheduled with psychiatry in the near future. - Discussed that our job is to place the patient in the best, most efficient care.  - Patient had intolerable side-effects while on Adderall. - Side-effects on Adderall:  Patient states that his side-effects were "day-by-day different."  Notes the first day on Adderall, he felt very stimulated.  States he had a very high heart rate, 110-120.  He also felt anxious, panicked, and a bit of terror.  Describes this feeling as feeling "juiced."  Notes these side-effects "toned down" over the next couple of days, and levelled out by the 5th day.  By day five, states he retained a moderate level of panic, but he no longer felt stimulated while on Adderall.  - Month's worth of medicine Vyvanse 20 mg provided today.  See med list.  - In the future, patient will obtain all refills of medication and treatment from psychiatry.  - Reviewed the "spokes of the wheel" of mood and health management.  Stressed the importance of ongoing prudent habits, including regular exercise, appropriate sleep hygiene, healthful dietary habits, and prayer/meditation to relax.  - Advised patient to exercise for at least 10 minutes daily.  Patient knows to start off slow, with 2 miles every other day to start, then maybe 3 miles every other day, and work up to a goal of 150-300 minutes of cardiovascular activity per week, according to [facility] guidelines.  - Advised patient to exercise on soft forgiving surfaces, or a machine like an elliptical, to help minimize impact on his joints.   Education and routine counseling performed. Handouts provided if pt  desired.   Medications Discontinued During This Encounter  Medication Reason  . amphetamine-dextroamphetamine (ADDERALL XR) 30 MG 24 hr capsule Side effect (s)  . lisdexamfetamine (VYVANSE) 20 MG capsule Reorder      Return if symptoms worsen or fail to improve, for also f/up 50months for chol reck, and all labs will be due then- .   -Reminded patient the need for yearly complete physical exam office visits in addition to office visits for management of the chronic diseases  -Gross side effects, risk and benefits, and alternatives of medications discussed with patient.  Patient is aware that all medications have potential side effects and we are unable to predict every side effect or drug-drug interaction that may occur.  Expresses verbal understanding and consents to current therapy plan and treatment regimen.   Please see AVS handed out to patient at the end of our visit for further patient instructions/ counseling done pertaining to today's office visit.    Note:  This document was prepared using Dragon voice recognition software and may include unintentional dictation errors.  This document serves as a record of services personally performed by Mellody Dance, DO. It was created on her behalf by Toni Amend, a trained medical scribe. The creation of this record is based on the scribe's personal observations and the provider's statements to them.   I have reviewed the above medical documentation for accuracy and completeness and I concur.  Mellody Dance, DO 11/10/2018 8:43 PM      ______________________________________________________________________    Subjective:  HPI:  Brent Brooks y.o. male  presents for 3 month follow up for evaluation of our treatment plan for pt's ADD/ ADHD.  Has an appointment with psychiatry in the near future.  Side effects on Adderall:  Patient states that his side-effects were "day-by-day different."  Notes the first day on  Adderall, he felt very stimulated.  States he had a very high heart rate, 110-120.  He also felt anxious, panicked, and a bit of terror.  Describes this feeling as feeling "juiced."  Notes these side-effects "toned down" over the next couple of days, and levelled out by the 5th day.  By day five, states he retained a moderate level of panic, but he no longer felt stimulated.  On vyvanse, he feels his heart rate may be a bit higher than normal.  Overall, however, he feels that the effects of the 20 mg dose are mild, with no major side-effects.  He ran out of Vyvanse on Monday, and was worried he would get acute depression (like he did after the Adderall), but this did not happen.  Feels "it's sort of like lighting a match in a typhoon, but at least it doesn't have any bad side-effects."  Since he went on the Vyvanse, states "I've been so tired," and sleeping too much.  Confirms depression.  States "my emotions have been all over the place lately, within the day."  He is moving for a mile every other day.  He exercises because he's supposed to feel better, but doesn't actually feel better when he exercises.  Notes that he does walk or run at a good pace, a 7-8 minute mile.  Notes that while he ran on the road, his joints would often hurt.  States that his constipation has resolved.   No problems updated.   Wt Readings from Last 3 Encounters:  11/07/18 145 lb (65.8 kg)  10/08/18 143 lb (64.9 kg)  09/11/18 139 lb 4.8 oz (63.2 kg)    BMI Readings from Last 3 Encounters:  11/07/18 24.13 kg/m  10/08/18 23.80 kg/m  09/11/18 23.18 kg/m    BP Readings from Last 3 Encounters:  11/07/18 118/74  10/08/18 121/69  09/11/18 121/80     Review of Systems: General:   No F/C, wt loss Pulm:   No DIB, SOB, pleuritic chest pain Card:  No CP, palpitations Abd:  No n/v/d or pain Ext:  No inc edema from baseline   Objective: Physical Exam: BP 118/74   Pulse 94   Temp 98.2 F (36.8 C)   Ht  5\' 5"  (1.651 m)   Wt 145 lb (65.8 kg)   SpO2 99%   BMI 24.13 kg/m  Body mass index is 24.13 kg/m. General: Well nourished, in no apparent distress. Eyes: PERRLA, EOMs, conjunctiva clr no swelling or erythema Neck: supple Resp: Respiratory effort- normal, ECTA B/L w/o W/R/R  Cardio: RRR w/o MRGs. Skin: Warm, dry without rashes, lesions, ecchymosis.  Neuro: Alert, Oriented Psych: Normal affect, Insight and Judgment appropriate.    Current Medications:  Current Outpatient Medications on File Prior to Visit  Medication Sig Dispense Refill  . clonazePAM (KLONOPIN) 0.5 MG tablet Take 0.5 mg by mouth 2 (two) times daily as needed for anxiety.    . ondansetron (ZOFRAN) 4 MG tablet Take 1 tablet (4 mg total) by mouth as needed for nausea or vomiting. 30 tablet 0  . Vitamin D, Ergocalciferol, (DRISDOL) 1.25 MG (50000 UT) CAPS capsule Take 1 capsule (50,000 Units total) by mouth every 7 (seven)  days. 12 capsule 3   No current facility-administered medications on file prior to visit.     Medical History:  Patient Active Problem List   Diagnosis Date Noted  . Hyperlipidemia 03/13/2018    Priority: High  . Low glucose level/  Low A1c at 4.7 03/13/2018    Priority: Medium  . Low HDL (under 40) 03/13/2018    Priority: Medium  . Vitamin D deficiency 08/09/2017    Priority: Low  . Adult ADHD 10/08/2018  . Person with feared complaint, no diagnosis made 03/13/2018  . 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 Anxiety 08/08/2017  . Asthma 08/08/2017  . Constipation 08/08/2017  . Depressive disorder 08/08/2017  . Generalized muscle ache 08/07/2017  . Nausea without vomiting 07/05/2016  . Flatulence, eructation, and gas pain 07/05/2016  . Acute reaction to situational stress 07/05/2016  . Bipolar 1 disorder, mixed ([place]) 04/03/2016  . Sleep disorder 04/03/2016  . Lipoma of forehead 04/03/2016  . Attention deficit hyperactivity disorder, predominantly inattentive type 04/03/2016   . Family history of prostate cancer- MGF 04/03/2016  . Family history of depression in MOM, [facility] 04/03/2016  . Family history of suicide MGM 04/03/2016  . FH: migraines- multiple relatives 04/03/2016  . Chronic headaches 04/03/2016    Allergies:  Allergies  Allergen Reactions  . Adderall Xr [Amphetamine-Dextroamphet Er]     Rapid heart rate, panic feeling, increased anxiety      Family history-  Reviewed; changed as appropriate  Social history-  Reviewed; changed as appropriate

## 2018-11-08 ENCOUNTER — Ambulatory Visit (INDEPENDENT_AMBULATORY_CARE_PROVIDER_SITE_OTHER): Payer: PRIVATE HEALTH INSURANCE | Admitting: Psychology

## 2018-11-08 DIAGNOSIS — F410 Panic disorder [episodic paroxysmal anxiety] without agoraphobia: Secondary | ICD-10-CM

## 2018-11-14 ENCOUNTER — Ambulatory Visit (INDEPENDENT_AMBULATORY_CARE_PROVIDER_SITE_OTHER): Payer: PRIVATE HEALTH INSURANCE | Admitting: Psychology

## 2018-11-14 ENCOUNTER — Other Ambulatory Visit: Payer: Self-pay

## 2018-11-14 DIAGNOSIS — F410 Panic disorder [episodic paroxysmal anxiety] without agoraphobia: Secondary | ICD-10-CM | POA: Diagnosis not present

## 2018-11-18 ENCOUNTER — Ambulatory Visit (INDEPENDENT_AMBULATORY_CARE_PROVIDER_SITE_OTHER): Payer: PRIVATE HEALTH INSURANCE | Admitting: Psychiatry

## 2018-11-18 ENCOUNTER — Encounter (HOSPITAL_COMMUNITY): Payer: Self-pay | Admitting: Psychiatry

## 2018-11-18 ENCOUNTER — Other Ambulatory Visit: Payer: Self-pay

## 2018-11-18 VITALS — BP 130/79 | HR 101 | Temp 98.1°F | Ht 69.0 in | Wt 138.0 lb

## 2018-11-18 DIAGNOSIS — F900 Attention-deficit hyperactivity disorder, predominantly inattentive type: Secondary | ICD-10-CM

## 2018-11-18 DIAGNOSIS — F411 Generalized anxiety disorder: Secondary | ICD-10-CM | POA: Diagnosis not present

## 2018-11-18 MED ORDER — ESCITALOPRAM OXALATE 10 MG PO TABS: 10.0000 mg | ORAL_TABLET | Freq: Every day | ORAL | 0 refills | Status: DC

## 2018-11-18 MED ORDER — LISDEXAMFETAMINE DIMESYLATE 30 MG PO CAPS: 30.0000 mg | ORAL_CAPSULE | Freq: Every day | ORAL | 0 refills | Status: DC

## 2018-11-18 NOTE — Progress Notes (Signed)
Psychiatric Initial Adult Assessment   Patient Identification: Brent Brooks MRN:  [id number] Date of Evaluation:  11/18/2018 Referral Source: Mellody Dance DO Chief Complaint:  Anxiety, depression Visit Diagnosis:    ICD-10-CM   1. Attention deficit hyperactivity disorder, predominantly inattentive type F90.0   2. GAD (generalized anxiety disorder) F41.1     History of Present Illness:  28 yo single male with ADHD diagnosed in early elementary school age. He  Was on meds (does not recall which ones) but stopped in 5th grade due to appetite suppression. Later while in college he  struggled again with focusing, procrastination and was tested for ADHD and restarted initially on Adderall but due to tachycardia was switched to Vyvanse which he tolerated much better. He also reports long standing problems with anxiety (both generalized and panic type) and depression. At one point he was suspected of having bipolar disorder and was placed on Geodon then Lamictal. He also was prescribed low dose of Seroquel for anxiety and has been on low dose of clonazepam prn anxiety on and off since college days. He does not, however, endorse having any clear manic episodes - his racing thoughts, irritability and impulsivity are a constant feature, he has never been euphoric, no periods of time without need for sleep and excessive energy either. He has a hx of a single hospitalization at [facility] - he was very anxious about his lack of progress at school and went there following advise of one of college friends. He admits to having passive suicidal thoughts for a long time on and off (no attempts), struggles with "lack of motivation, direction in life". He has no hx of psychosis, no alcohol, drug abuse.  Associated Signs/Symptoms: Depression Symptoms:  depressed mood, difficulty concentrating, suicidal thoughts without plan, anxiety, panic attacks, (Hypo) Manic Symptoms:   Distractibility, Impulsivity, Anxiety Symptoms:  Excessive Worry, Panic Symptoms, Psychotic Symptoms:  none PTSD Symptoms: Negative  Past Psychiatric History: see above  Previous Psychotropic Medications: Yes   Substance Abuse History in the last 12 months:  No.  Consequences of Substance Abuse: Negative  Past Medical History:  Past Medical History:  Diagnosis Date  . Anxiety   . Asthma   . Attention deficit hyperactivity disorder (ADHD) 04/03/2016  . Bipolar 1 disorder, mixed ([place]) 04/03/2016   Dr Graylon Gunning- in [place], [place]  . Depression   . Sleep disorder 04/03/2016    Past Surgical History:  Procedure Laterality Date  . ESOPHAGOGASTRODUODENOSCOPY ENDOSCOPY    . WISDOM TOOTH EXTRACTION      Family Psychiatric History: reviewed  Family History:  Family History  Problem Relation Age of Onset  . Depression Mother   . Migraines Mother   . Migraines Brother   . Suicidality Maternal Grandmother   . Migraines Maternal Grandmother   . Cancer Maternal Grandfather        prostate  . Cancer Paternal Grandfather        unknown    Social History:   Social History   Socioeconomic History  . Marital status: Single    Spouse name: Not on file  . Number of children: Not on file  . Years of education: Not on file  . Highest education level: Not on file  Occupational History  . Not on file  Social Needs  . Financial resource strain: Not on file  . Food insecurity:    Worry: Not on file    Inability: Not on file  . Transportation needs:    Medical: Not on  file    Non-medical: Not on file  Tobacco Use  . Smoking status: Never Smoker  . Smokeless tobacco: Never Used  Substance and Sexual Activity  . Alcohol use: No  . Drug use: No  . Sexual activity: Not Currently    Birth control/protection: Condom  Lifestyle  . Physical activity:    Days per week: Not on file    Minutes per session: Not on file  . Stress: Not on file  Relationships  . Social connections:     Talks on phone: Not on file    Gets together: Not on file    Attends religious service: Not on file    Active member of club or organization: Not on file    Attends meetings of clubs or organizations: Not on file    Relationship status: Not on file  Other Topics Concern  . Not on file  Social History Narrative  . Not on file    Additional Social History: [place] educated ([place]) lives with grandmother, does not work.  Allergies:   Allergies  Allergen Reactions  . Adderall Xr [Amphetamine-Dextroamphet Er]     Rapid heart rate, panic feeling, increased anxiety     Metabolic Disorder Labs: Lab Results  Component Value Date   HGBA1C 4.8 03/13/2018   No results found for: PROLACTIN Lab Results  Component Value Date   CHOL 180 03/13/2018   TRIG 114 03/13/2018   HDL 38 (L) 03/13/2018   CHOLHDL 4.7 03/13/2018   LDLCALC 119 (H) 03/13/2018   LDLCALC 124 (H) 08/07/2017   Lab Results  Component Value Date   TSH 0.995 08/07/2017    Therapeutic Level Labs: No results found for: LITHIUM No results found for: CBMZ No results found for: VALPROATE  Current Medications: Current Outpatient Medications  Medication Sig Dispense Refill  . clonazePAM (KLONOPIN) 0.5 MG tablet Take 0.5 mg by mouth 2 (two) times daily as needed for anxiety.    Marland Kitchen lisdexamfetamine (VYVANSE) 30 MG capsule Take 1 capsule (30 mg total) by mouth daily for 30 days. 30 capsule 0  . ondansetron (ZOFRAN) 4 MG tablet Take 1 tablet (4 mg total) by mouth as needed for nausea or vomiting. 30 tablet 0  . Vitamin D, Ergocalciferol, (DRISDOL) 1.25 MG (50000 UT) CAPS capsule Take 1 capsule (50,000 Units total) by mouth every 7 (seven) days. 12 capsule 3  . escitalopram (LEXAPRO) 10 MG tablet Take 1 tablet (10 mg total) by mouth daily for 30 days. 30 tablet 0   No current facility-administered medications for this visit.     Musculoskeletal: Strength & Muscle Tone: within normal limits Gait & Station:  normal Patient leans: N/A  Psychiatric Specialty Exam: Review of Systems  Constitutional: Negative.   HENT: Negative.   Eyes: Negative.   Respiratory: Negative.   Cardiovascular: Negative.   Gastrointestinal: Positive for constipation.  Genitourinary: Negative.   Musculoskeletal: Negative.   Skin: Negative.   Neurological: Negative.   Endo/Heme/Allergies: Negative.   Psychiatric/Behavioral: Positive for depression. The patient is nervous/anxious.     Blood pressure 130/79, pulse (!) 101, temperature 98.1 F (36.7 C), height 5\' 9"  (1.753 m), weight 138 lb (62.6 kg).Body mass index is 20.38 kg/m.  General Appearance: Casual  Eye Contact:  Fair  Speech:  Clear and Coherent  Volume:  Normal  Mood:  Anxious and Depressed  Affect:  Constricted  Thought Process:  Descriptions of Associations: Circumstantial  Orientation:  Full (Time, Place, and Person)  Thought Content:  Logical  Suicidal Thoughts:  No  Homicidal Thoughts:  No  Memory:  Immediate;   Good Recent;   Good Remote;   Good  Judgement:  Fair  Insight:  Fair  Psychomotor Activity:  Restlessness  Concentration:  Concentration: Fair  Recall:  Good  Fund of Knowledge:Good  Language: Good  Akathisia:  Negative  Handed:  Right  AIMS (if indicated):  not done  Assets:  Desire for Improvement Housing Physical Health Vocational/Educational  ADL's:  Intact  Cognition: WNL  Sleep:  Fair   Screenings: GAD-7     Office Visit from 11/07/2018 in [place] at [facility]  Total GAD-7 Score  12    PHQ2-9     Office Visit from 11/07/2018 in [place] at [facility] Visit from 10/08/2018 in [place] at [facility] Visit from 09/11/2018 in [place] at [facility] Visit from 03/13/2018 in [place] at [facility] Visit from 08/07/2017 in [place] at [facility] Total Score  4  2  2  2  4   PHQ-9 Total Score   16  9  12  8  12       Assessment and Plan: 28 yo single male with long hx of ADD, depression and mixed anxiety. He had been also dx with having bipolar disorder but I have doubts if he indeed has one as he is unable to clearly describe any distinct manic/hypomanic episodes. Poor response to Adderall (tachycardia) fair to low dose of Vyvanse. He has never been on antidepressants for depression/anxiety. He has episodic passive SI, none at this time. Sleep fair, appetite normal. He admittedly worries a lot about his health although he has no chronic medical conditions.  Plan: We will try Lexapro for anxiety/depression, continue clonazepam prn panic attacks, and increase Vvanse to 30 mg to better address residual sx of ADHD. The plan was discussed with patient. I spend 45 minutes in direct face to face clinical contact with the patient and devoted approximately 50% of this time to explanation of diagnosis, discussion of treatment options and med education. Return to clinic in 4 weeks.   Stephanie Acre, MD 3/16/20201:56 PM

## 2018-11-21 ENCOUNTER — Ambulatory Visit (INDEPENDENT_AMBULATORY_CARE_PROVIDER_SITE_OTHER): Payer: PRIVATE HEALTH INSURANCE | Admitting: Psychology

## 2018-11-21 ENCOUNTER — Other Ambulatory Visit: Payer: Self-pay

## 2018-11-21 DIAGNOSIS — F410 Panic disorder [episodic paroxysmal anxiety] without agoraphobia: Secondary | ICD-10-CM | POA: Diagnosis not present

## 2018-11-27 ENCOUNTER — Ambulatory Visit: Payer: PRIVATE HEALTH INSURANCE | Admitting: Psychology

## 2018-12-23 ENCOUNTER — Other Ambulatory Visit: Payer: Self-pay

## 2018-12-23 ENCOUNTER — Ambulatory Visit (INDEPENDENT_AMBULATORY_CARE_PROVIDER_SITE_OTHER): Payer: PRIVATE HEALTH INSURANCE | Admitting: Psychiatry

## 2018-12-23 DIAGNOSIS — F900 Attention-deficit hyperactivity disorder, predominantly inattentive type: Secondary | ICD-10-CM

## 2018-12-23 DIAGNOSIS — F411 Generalized anxiety disorder: Secondary | ICD-10-CM

## 2018-12-23 MED ORDER — LISDEXAMFETAMINE DIMESYLATE 30 MG PO CAPS: 30.0000 mg | ORAL_CAPSULE | Freq: Every day | ORAL | 0 refills | Status: DC

## 2018-12-23 MED ORDER — ESCITALOPRAM OXALATE 10 MG PO TABS: 10.0000 mg | ORAL_TABLET | Freq: Every day | ORAL | 2 refills | Status: DC

## 2018-12-23 NOTE — Progress Notes (Signed)
BH MD/PA/NP OP Progress Note  12/23/2018 1:21 PM Brent Brooks  MRN:  [id number] Interview was conducted telephone and I verified that I was speaking with the correct person using two identifiers. I discussed the limitations of evaluation and management by telemedicine and  the availability of in person appointments. Patient expressed understanding and agreed to proceed.  Chief Complaint: "I only had one panic attack in the past month".  HPI: 28 yo single male with long hx of ADD, depression and mixed anxiety. He had been also dx with having bipolar disorder but I have doubts if he indeed has one as he is unable to clearly describe any distinct manic/hypomanic episodes. Poor response to Adderall (tachycardia) fair to low dose of Vyvanse. He has never been on antidepressants for depression/anxiety. He has episodic passive SI, none at this time. Sleep fair, appetite normal. He admittedly worries a lot about his health although he has no chronic medical conditions. It appears that he has also obsessive tendencies. He has been cleaning ("decluttering" his grandmother's house for sevaral hours a day to make it "his own". He still has good sleep and good appetite. He admits that he would rather work on that than start working on his classes/assignements. He tolerates 30 mg of Vyvanse (was on 20 g earlier) well. Anxiety low - only needed clonazepam once in the past month so Lexapro "must be working". No SI.   PVisit Diagnosis:    ICD-10-CM   1. Attention deficit hyperactivity disorder, predominantly inattentive type F90.0   2. GAD (generalized anxiety disorder) F41.1     Past Psychiatric History: Please refer to intake H&P.  Past Medical History:  Past Medical History:  Diagnosis Date  . Anxiety   . Asthma   . Attention deficit hyperactivity disorder (ADHD) 04/03/2016  . Bipolar 1 disorder, mixed ([place]) 04/03/2016   Dr Graylon Gunning- in [place], [place]  . Depression   . Sleep disorder 04/03/2016    Past  Surgical History:  Procedure Laterality Date  . ESOPHAGOGASTRODUODENOSCOPY ENDOSCOPY    . WISDOM TOOTH EXTRACTION      Family Psychiatric History: Reviewed  Family History:  Family History  Problem Relation Age of Onset  . Depression Mother   . Migraines Mother   . Migraines Brother   . Suicidality Maternal Grandmother   . Migraines Maternal Grandmother   . Cancer Maternal Grandfather        prostate  . Cancer Paternal Grandfather        unknown    Social History:  Social History   Socioeconomic History  . Marital status: Single    Spouse name: Not on file  . Number of children: Not on file  . Years of education: Not on file  . Highest education level: Not on file  Occupational History  . Not on file  Social Needs  . Financial resource strain: Not on file  . Food insecurity:    Worry: Not on file    Inability: Not on file  . Transportation needs:    Medical: Not on file    Non-medical: Not on file  Tobacco Use  . Smoking status: Never Smoker  . Smokeless tobacco: Never Used  Substance and Sexual Activity  . Alcohol use: No  . Drug use: No  . Sexual activity: Not Currently    Birth control/protection: Condom  Lifestyle  . Physical activity:    Days per week: Not on file    Minutes per session: Not on file  . Stress:  Not on file  Relationships  . Social connections:    Talks on phone: Not on file    Gets together: Not on file    Attends religious service: Not on file    Active member of club or organization: Not on file    Attends meetings of clubs or organizations: Not on file    Relationship status: Not on file  Other Topics Concern  . Not on file  Social History Narrative  . Not on file    Allergies:  Allergies  Allergen Reactions  . Adderall Xr [Amphetamine-Dextroamphet Er]     Rapid heart rate, panic feeling, increased anxiety     Metabolic Disorder Labs: Lab Results  Component Value Date   HGBA1C 4.8 03/13/2018   No results found  for: PROLACTIN Lab Results  Component Value Date   CHOL 180 03/13/2018   TRIG 114 03/13/2018   HDL 38 (L) 03/13/2018   CHOLHDL 4.7 03/13/2018   LDLCALC 119 (H) 03/13/2018   LDLCALC 124 (H) 08/07/2017   Lab Results  Component Value Date   TSH 0.995 08/07/2017    Therapeutic Level Labs: No results found for: LITHIUM No results found for: VALPROATE No components found for:  CBMZ  Current Medications: Current Outpatient Medications  Medication Sig Dispense Refill  . clonazePAM (KLONOPIN) 0.5 MG tablet Take 0.5 mg by mouth 2 (two) times daily as needed for anxiety.    Marland Kitchen escitalopram (LEXAPRO) 10 MG tablet Take 1 tablet (10 mg total) by mouth daily. 30 tablet 2  . lisdexamfetamine (VYVANSE) 30 MG capsule Take 1 capsule (30 mg total) by mouth daily for 30 days. 30 capsule 0  . ondansetron (ZOFRAN) 4 MG tablet Take 1 tablet (4 mg total) by mouth as needed for nausea or vomiting. 30 tablet 0  . Vitamin D, Ergocalciferol, (DRISDOL) 1.25 MG (50000 UT) CAPS capsule Take 1 capsule (50,000 Units total) by mouth every 7 (seven) days. 12 capsule 3   No current facility-administered medications for this visit.      Psychiatric Specialty Exam: Review of Systems  Psychiatric/Behavioral: The patient is nervous/anxious.   All other systems reviewed and are negative.   There were no vitals taken for this visit.There is no height or weight on file to calculate BMI.  General Appearance: NA  Eye Contact:  NA  Speech:  Clear and Coherent  Volume:  Normal  Mood:  Anxious  Affect:  NA  Thought Process:  Goal Directed  Orientation:  Full (Time, Place, and Person)  Thought Content: Logical   Suicidal Thoughts:  No  Homicidal Thoughts:  No  Memory:  Immediate;   Good Recent;   Good Remote;   Good  Judgement:  Intact  Insight:  Fair  Psychomotor Activity:  NA  Concentration:  Concentration: Good  Recall:  Good  Fund of Knowledge: Good  Language: Good  Akathisia:  NA  Handed:  Right   AIMS (if indicated): not done  Assets:  Communication Skills Desire for Improvement Financial Resources/Insurance Housing Physical Health Talents/Skills  ADL's:  Intact  Cognition: WNL  Sleep:  Good   Screenings: GAD-7     Office Visit from 11/07/2018 in [place] at [facility]  Total GAD-7 Score  12    PHQ2-9     Office Visit from 11/07/2018 in [place] at [facility] Visit from 10/08/2018 in [place] at [facility] Visit from 09/11/2018 in [place] at  [facility] Office Visit from 03/13/2018 in [place] at [facility] Visit from 08/07/2017 in [facility] Total Score  4  2  2  2  4   PHQ-9 Total Score  16  9  12  8  12        Assessment and Plan: 28 yo single male with long hx of ADD, depression and mixed anxiety. He had been also dx with having bipolar disorder but I have doubts if he indeed has one as he is unable to clearly describe any distinct manic/hypomanic episodes. Poor response to Adderall (tachycardia) fair to low dose of Vyvanse. He has never been on antidepressants for depression/anxiety. He has episodic passive SI, none at this time. Sleep fair, appetite normal. He admittedly worries a lot about his health although he has no chronic medical conditions. It appears that he has also obsessive tendencies. He has been cleaning ("decluttering" his grandmother's house for sevaral hours a day to make it "his own". He still has good sleep and good appetite. He admits that he would rather work on that than start working on his classes/assignements. He tolerates 30 mg of Vyvanse (was on 20 g earlier) well. Anxiety low - only needed clonazepam once in the past month so Lexapro "must be working".   Plan No change in medications/doses: continue Lexapro 10 mg, Vyvanse 30 mg and clonazepam 0.5 mg prn anxiety. He has still clonazepam; rx for Lexapro and Vyvanse will be  given. Return to clinic in two months.  Stephanie Acre, MD 12/23/2018, 1:21 PM

## 2018-12-24 ENCOUNTER — Ambulatory Visit (INDEPENDENT_AMBULATORY_CARE_PROVIDER_SITE_OTHER): Payer: PRIVATE HEALTH INSURANCE | Admitting: Psychology

## 2018-12-24 DIAGNOSIS — F410 Panic disorder [episodic paroxysmal anxiety] without agoraphobia: Secondary | ICD-10-CM

## 2019-01-07 ENCOUNTER — Ambulatory Visit (INDEPENDENT_AMBULATORY_CARE_PROVIDER_SITE_OTHER): Payer: PRIVATE HEALTH INSURANCE | Admitting: Psychology

## 2019-01-07 DIAGNOSIS — F410 Panic disorder [episodic paroxysmal anxiety] without agoraphobia: Secondary | ICD-10-CM | POA: Diagnosis not present

## 2019-01-23 ENCOUNTER — Telehealth (HOSPITAL_COMMUNITY): Payer: Self-pay

## 2019-01-23 MED ORDER — LISDEXAMFETAMINE DIMESYLATE 30 MG PO CAPS: 30.0000 mg | ORAL_CAPSULE | Freq: Every day | ORAL | 0 refills | Status: DC

## 2019-01-23 NOTE — Telephone Encounter (Signed)
done

## 2019-01-23 NOTE — Telephone Encounter (Signed)
This is Dr. Audria Nine patient. Patient called requesting a refill on his Vyvanse 30mg . He uses [facility] on [address].Thank you.

## 2019-01-28 ENCOUNTER — Ambulatory Visit (INDEPENDENT_AMBULATORY_CARE_PROVIDER_SITE_OTHER): Payer: PRIVATE HEALTH INSURANCE | Admitting: Psychology

## 2019-01-28 DIAGNOSIS — F410 Panic disorder [episodic paroxysmal anxiety] without agoraphobia: Secondary | ICD-10-CM | POA: Diagnosis not present

## 2019-02-18 ENCOUNTER — Ambulatory Visit (INDEPENDENT_AMBULATORY_CARE_PROVIDER_SITE_OTHER): Payer: PRIVATE HEALTH INSURANCE | Admitting: Psychiatry

## 2019-02-18 ENCOUNTER — Ambulatory Visit (INDEPENDENT_AMBULATORY_CARE_PROVIDER_SITE_OTHER): Payer: PRIVATE HEALTH INSURANCE | Admitting: Psychology

## 2019-02-18 ENCOUNTER — Other Ambulatory Visit: Payer: Self-pay

## 2019-02-18 DIAGNOSIS — F900 Attention-deficit hyperactivity disorder, predominantly inattentive type: Secondary | ICD-10-CM | POA: Diagnosis not present

## 2019-02-18 DIAGNOSIS — F411 Generalized anxiety disorder: Secondary | ICD-10-CM

## 2019-02-18 DIAGNOSIS — F410 Panic disorder [episodic paroxysmal anxiety] without agoraphobia: Secondary | ICD-10-CM | POA: Diagnosis not present

## 2019-02-18 MED ORDER — LISDEXAMFETAMINE DIMESYLATE 30 MG PO CAPS: 30.0000 mg | ORAL_CAPSULE | Freq: Every day | ORAL | 0 refills | Status: DC

## 2019-02-18 MED ORDER — ESCITALOPRAM OXALATE 20 MG PO TABS: 20.0000 mg | ORAL_TABLET | Freq: Every day | ORAL | 2 refills | Status: DC

## 2019-02-18 NOTE — Progress Notes (Signed)
[place] MD/PA/NP OP Progress Note  02/18/2019 2:17 PM Brent Brooks  MRN:  [id number] Interview was conducted by phone and I verified that I was speaking with the correct person using two identifiers. I discussed the limitations of evaluation and management by telemedicine and  the availability of in person appointments. Patient expressed understanding and agreed to proceed.  Chief Complaint: Anxiety.  HPI: 28 yo single male with long hx of ADD, depression and mixed anxiety. He had been also dx with having bipolar disorder but I have doubts if he indeed has one as he is unable to clearly describe any distinct manic/hypomanic episodes. Poor response to Adderall (tachycardia) fair to low dose of Vyvanse. He has never been on antidepressants for depression/anxiety. He has a hx of episodic passive SI, none at this time. Sleep fair, appetite normal. He admittedly worries a lot about his health although he has no chronic medical conditions. It appears that he has also obsessive tendencies and possibly compulsions. He still is engaged in cleaning ("decluttering" his grandmother's house and has been doing this (often for sevaral hours a day) for the past 3 months. He admits that it feels excessive. He reports having good sleep and good appetite. He tolerates 30 mg of Vyvanse (was on 20 g earlier) well. Anxiety of the panic type low - only needed clonazepam once in the past 3 weeks.  Hr is on a medium 10 mg dose of Lexapro.              Visit Diagnosis:    ICD-10-CM   1. Attention deficit hyperactivity disorder, predominantly inattentive type  F90.0   2. GAD (generalized anxiety disorder)  F41.1     Past Psychiatric History: Please see intake H@P /  Past Medical History:  Past Medical History:  Diagnosis Date  . Anxiety   . Asthma   . Attention deficit hyperactivity disorder (ADHD) 04/03/2016  . Bipolar 1 disorder, mixed ([place]) 04/03/2016   Dr Graylon Gunning- in [place], [place]  . Depression   . Sleep disorder  04/03/2016    Past Surgical History:  Procedure Laterality Date  . ESOPHAGOGASTRODUODENOSCOPY ENDOSCOPY    . WISDOM TOOTH EXTRACTION      Family Psychiatric History: Reviewed.  Family History:  Family History  Problem Relation Age of Onset  . Depression Mother   . Migraines Mother   . Migraines Brother   . Suicidality Maternal Grandmother   . Migraines Maternal Grandmother   . Cancer Maternal Grandfather        prostate  . Cancer Paternal Grandfather        unknown    Social History:  Social History   Socioeconomic History  . Marital status: Single    Spouse name: Not on file  . Number of children: Not on file  . Years of education: Not on file  . Highest education level: Not on file  Occupational History  . Not on file  Social Needs  . Financial resource strain: Not on file  . Food insecurity    Worry: Not on file    Inability: Not on file  . Transportation needs    Medical: Not on file    Non-medical: Not on file  Tobacco Use  . Smoking status: Never Smoker  . Smokeless tobacco: Never Used  Substance and Sexual Activity  . Alcohol use: No  . Drug use: No  . Sexual activity: Not Currently    Birth control/protection: Condom  Lifestyle  . Physical activity  Days per week: Not on file    Minutes per session: Not on file  . Stress: Not on file  Relationships  . Social Herbalist on phone: Not on file    Gets together: Not on file    Attends religious service: Not on file    Active member of club or organization: Not on file    Attends meetings of clubs or organizations: Not on file    Relationship status: Not on file  Other Topics Concern  . Not on file  Social History Narrative  . Not on file    Allergies:  Allergies  Allergen Reactions  . Adderall Xr [Amphetamine-Dextroamphet Er]     Rapid heart rate, panic feeling, increased anxiety     Metabolic Disorder Labs: Lab Results  Component Value Date   HGBA1C 4.8 03/13/2018   No  results found for: PROLACTIN Lab Results  Component Value Date   CHOL 180 03/13/2018   TRIG 114 03/13/2018   HDL 38 (L) 03/13/2018   CHOLHDL 4.7 03/13/2018   LDLCALC 119 (H) 03/13/2018   LDLCALC 124 (H) 08/07/2017   Lab Results  Component Value Date   TSH 0.995 08/07/2017    Therapeutic Level Labs: No results found for: LITHIUM No results found for: VALPROATE No components found for:  CBMZ  Current Medications: Current Outpatient Medications  Medication Sig Dispense Refill  . clonazePAM (KLONOPIN) 0.5 MG tablet Take 0.5 mg by mouth 2 (two) times daily as needed for anxiety.    Marland Kitchen escitalopram (LEXAPRO) 20 MG tablet Take 1 tablet (20 mg total) by mouth daily. 30 tablet 2  . lisdexamfetamine (VYVANSE) 30 MG capsule Take 1 capsule (30 mg total) by mouth daily for 30 days. 30 capsule 0  . ondansetron (ZOFRAN) 4 MG tablet Take 1 tablet (4 mg total) by mouth as needed for nausea or vomiting. 30 tablet 0  . Vitamin D, Ergocalciferol, (DRISDOL) 1.25 MG (50000 UT) CAPS capsule Take 1 capsule (50,000 Units total) by mouth every 7 (seven) days. 12 capsule 3   No current facility-administered medications for this visit.      Psychiatric Specialty Exam: Review of Systems  Psychiatric/Behavioral: The patient is nervous/anxious.   All other systems reviewed and are negative.   There were no vitals taken for this visit.There is no height or weight on file to calculate BMI.  General Appearance: NA  Eye Contact:  NA  Speech:  Clear and Coherent  Volume:  Normal  Mood:  Anxious  Affect:  NA  Thought Process:  Goal Directed  Orientation:  Full (Time, Place, and Person)  Thought Content: Logical   Suicidal Thoughts:  No  Homicidal Thoughts:  No  Memory:  Immediate;   Good Recent;   Good Remote;   Good  Judgement:  Good  Insight:  Fair  Psychomotor Activity:  NA  Concentration:  Concentration: Fair  Recall:  Good  Fund of Knowledge: Good  Language: Good  Akathisia:  Negative   Handed:  Right  AIMS (if indicated): not done  Assets:  Communication Skills Desire for Improvement Financial Resources/Insurance Housing Physical Health  ADL's:  Intact  Cognition: WNL  Sleep:  Good   Screenings: GAD-7     Office Visit from 11/07/2018 in [place] at [facility]  Total GAD-7 Score  12    PHQ2-9     Office Visit from 11/07/2018 in [place] at [facility] Visit from 10/08/2018 in  [place] [facility] at [facility] Visit from 09/11/2018 in [place] at [facility] Visit from 03/13/2018 in [place] at [facility] Visit from 08/07/2017 in [place] at [facility]  PHQ-2 Total Score  4  2  2  2  4   PHQ-9 Total Score  16  9  12  8  12        Assessment and Plan: 28 yo single male with long hx of ADD, depression and mixed anxiety. He had been also dx with having bipolar disorder but I have doubts if he indeed has one as he is unable to clearly describe any distinct manic/hypomanic episodes. Poor response to Adderall (tachycardia) fair to low dose of Vyvanse. He has never been on antidepressants for depression/anxiety. He has a hx of episodic passive SI, none at this time. Sleep fair, appetite normal. He admittedly worries a lot about his health although he has no chronic medical conditions. It appears that he has also obsessive tendencies and possibly compulsions. He still is engaged in cleaning ("decluttering" his grandmother's house and has been doing this (often for sevaral hours a day) for the past 3 months. He admits that it feels excessive. He reports having good sleep and good appetite. He tolerates 30 mg of Vyvanse (was on 20 g earlier) well. Anxiety of the panic type low - only needed clonazepam once in the past 3 weeks.  Hr is on a medium 10 mg dose of Lexapro.              Plan: we will increase Lexapro to 20 mg to better address obsessions/compulsions while  Vyvanse 30  mg and clonazepam 0.5 mg prn anxiety will remain the same. He has still clonazepam; rx for Lexapro and Vyvanse will be given. Return to clinic in a month.    Stephanie Acre, MD 02/18/2019, 2:17 PM

## 2019-03-12 ENCOUNTER — Ambulatory Visit: Payer: Self-pay | Admitting: Family Medicine

## 2019-03-12 ENCOUNTER — Ambulatory Visit: Payer: PRIVATE HEALTH INSURANCE | Admitting: Psychology

## 2019-03-20 ENCOUNTER — Ambulatory Visit (INDEPENDENT_AMBULATORY_CARE_PROVIDER_SITE_OTHER): Payer: PRIVATE HEALTH INSURANCE | Admitting: Psychiatry

## 2019-03-20 ENCOUNTER — Other Ambulatory Visit: Payer: Self-pay

## 2019-03-20 DIAGNOSIS — F4520 Hypochondriacal disorder, unspecified: Secondary | ICD-10-CM

## 2019-03-20 DIAGNOSIS — F909 Attention-deficit hyperactivity disorder, unspecified type: Secondary | ICD-10-CM

## 2019-03-20 DIAGNOSIS — F411 Generalized anxiety disorder: Secondary | ICD-10-CM | POA: Diagnosis not present

## 2019-03-20 MED ORDER — LISDEXAMFETAMINE DIMESYLATE 30 MG PO CAPS: 30.0000 mg | ORAL_CAPSULE | Freq: Every day | ORAL | 0 refills | Status: DC

## 2019-03-20 NOTE — Progress Notes (Signed)
BH MD/PA/NP OP Progress Note  03/20/2019 2:16 PM Brent Brooks  MRN:  [id number] Interview was conducted by phone and I verified that I was speaking with the correct person using two identifiers. I discussed the limitations of evaluation and management by telemedicine and  the availability of in person appointments. Patient expressed understanding and agreed to proceed.  Chief Complaint: "I worry less".  HPI: 28 yo single male with long hx ofADD, depression and mixed anxiety.He had been also dx with having bipolar disorder but I have doubts if he indeed has one as he is unable to clearly describe any distinct manic/hypomanic episodes. Poor response to Adderall (tachycardia) fair to low dose of Vyvanse. He has never been on antidepressants for depression/anxiety. He has a hx of episodic passive SI, none at this time. Sleep fair (with 25 mg of Benadryl), appetite normal. He admittedly worries a lot about his health although he has no chronic medical conditions. Today he expressed concern about having pressure in his brain - which is not a new problem. It appears that he has also obsessive tendencies and possibly compulsions. He still is engaged in cleaning ("decluttering") his grandmother's house and has been doing this (often for sevaral hours a day) for the past 4 months. He admits that it feels excessive. Still he reports that anxiety and tendency to ruminate over many things has decreased with increase of Lexapro dose. He tolerates 30 mg of Vyvanse (was on 20 g earlier) well. Anxiety of the panic type practically resolved - did not need to take clonazepam in weeks.  Visit Diagnosis:    ICD-10-CM   1. Adult ADHD  F90.9   2. GAD (generalized anxiety disorder)  F41.1   3. 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  Past Medical History:  Past Medical History:  Diagnosis Date  . Anxiety   . Asthma   . Attention deficit  hyperactivity disorder (ADHD) 04/03/2016  . Bipolar 1 disorder, mixed ([place]) 04/03/2016   Dr Graylon Gunning- in [place], [place]  . Depression   . Sleep disorder 04/03/2016    Past Surgical History:  Procedure Laterality Date  . ESOPHAGOGASTRODUODENOSCOPY ENDOSCOPY    . WISDOM TOOTH EXTRACTION      Family Psychiatric History: Reviewed.  Family History:  Family History  Problem Relation Age of Onset  . Depression Mother   . Migraines Mother   . Migraines Brother   . Suicidality Maternal Grandmother   . Migraines Maternal Grandmother   . Cancer Maternal Grandfather        prostate  . Cancer Paternal Grandfather        unknown    Social History:  Social History   Socioeconomic History  . Marital status: Single    Spouse name: Not on file  . Number of children: Not on file  . Years of education: Not on file  . Highest education level: Not on file  Occupational History  . Not on file  Social Needs  . Financial resource strain: Not on file  . Food insecurity    Worry: Not on file    Inability: Not on file  . Transportation needs    Medical: Not on file    Non-medical: Not on file  Tobacco Use  . Smoking status: Never Smoker  . Smokeless tobacco: Never Used  Substance and Sexual Activity  . Alcohol use: No  . Drug use: No  . Sexual activity: Not Currently  Birth control/protection: Condom  Lifestyle  . Physical activity    Days per week: Not on file    Minutes per session: Not on file  . Stress: Not on file  Relationships  . Social Herbalist on phone: Not on file    Gets together: Not on file    Attends religious service: Not on file    Active member of club or organization: Not on file    Attends meetings of clubs or organizations: Not on file    Relationship status: Not on file  Other Topics Concern  . Not on file  Social History Narrative  . Not on file    Allergies:  Allergies  Allergen Reactions  . Adderall Xr [Amphetamine-Dextroamphet Er]      Rapid heart rate, panic feeling, increased anxiety     Metabolic Disorder Labs: Lab Results  Component Value Date   HGBA1C 4.8 03/13/2018   No results found for: PROLACTIN Lab Results  Component Value Date   CHOL 180 03/13/2018   TRIG 114 03/13/2018   HDL 38 (L) 03/13/2018   CHOLHDL 4.7 03/13/2018   LDLCALC 119 (H) 03/13/2018   LDLCALC 124 (H) 08/07/2017   Lab Results  Component Value Date   TSH 0.995 08/07/2017    Therapeutic Level Labs: No results found for: LITHIUM No results found for: VALPROATE No components found for:  CBMZ  Current Medications: Current Outpatient Medications  Medication Sig Dispense Refill  . clonazePAM (KLONOPIN) 0.5 MG tablet Take 0.5 mg by mouth 2 (two) times daily as needed for anxiety.    Marland Kitchen escitalopram (LEXAPRO) 20 MG tablet Take 1 tablet (20 mg total) by mouth daily. 30 tablet 2  . lisdexamfetamine (VYVANSE) 30 MG capsule Take 1 capsule (30 mg total) by mouth daily for 30 days. 30 capsule 0  . ondansetron (ZOFRAN) 4 MG tablet Take 1 tablet (4 mg total) by mouth as needed for nausea or vomiting. 30 tablet 0  . Vitamin D, Ergocalciferol, (DRISDOL) 1.25 MG (50000 UT) CAPS capsule Take 1 capsule (50,000 Units total) by mouth every 7 (seven) days. 12 capsule 3   No current facility-administered medications for this visit.      Psychiatric Specialty Exam: Review of Systems  Neurological: Positive for headaches.  Psychiatric/Behavioral: The patient is nervous/anxious.   All other systems reviewed and are negative.   There were no vitals taken for this visit.There is no height or weight on file to calculate BMI.  General Appearance: NA  Eye Contact:  NA  Speech:  Clear and Coherent and Normal Rate  Volume:  Normal  Mood:  Anxious  Affect:  NA  Thought Process:  Goal Directed  Orientation:  Full (Time, Place, and Person)  Thought Content: Logical and Rumination   Suicidal Thoughts:  No  Homicidal Thoughts:  No  Memory:  Immediate;    Good Recent;   Good Remote;   Good  Judgement:  Good  Insight:  Fair  Psychomotor Activity:  NA  Concentration:  Concentration: Good  Recall:  Good  Fund of Knowledge: Good  Language: Good  Akathisia:  Negative  Handed:  Right  AIMS (if indicated): not done  Assets:  Communication Skills Desire for Improvement Financial Resources/Insurance Housing Physical Health Talents/Skills  ADL's:  Intact  Cognition: WNL  Sleep:  Good   Screenings: GAD-7     Office Visit from 11/07/2018 in [facility] at [facility]  Total GAD-7 Score  12  PHQ2-9     Office Visit from 11/07/2018 in [place] at [facility] Visit from 10/08/2018 in [place] at [facility] Visit from 09/11/2018 in [place] at [facility] Visit from 03/13/2018 in [place] at [facility] Visit from 08/07/2017 in [place] at [facility]  PHQ-2 Total Score  4  2  2  2  4   PHQ-9 Total Score  16  9  12  8  12        Assessment and Plan: 28 yo single male with long hx ofADD, depression and mixed anxiety.He had been also dx with having bipolar disorder but I have doubts if he indeed has one as he is unable to clearly describe any distinct manic/hypomanic episodes. Poor response to Adderall (tachycardia) fair to low dose of Vyvanse. He has never been on antidepressants for depression/anxiety. He has a hx of episodic passive SI, none at this time. Sleep fair (with 25 mg of Benadryl), appetite normal. He admittedly worries a lot about his health (hypochondriasis) although he has no chronic medical conditions. Today he expressed concern about having pressure in his brain - which is not a new problem. It appears that he has also obsessive tendencies and possibly compulsions. He still is engaged in cleaning ("decluttering") his grandmother's house and has been doing this (often for sevaral hours a day) for the past 4 months. He  admits that it feels excessive. Still he reports that anxiety and tendency to ruminate over many things has decreased with increase of Lexapro dose. He tolerates 30 mg of Vyvanse (was on 20 g earlier) well. Anxiety of the panic type practically resolved - did not need to take clonazepam in weeks.  Plan: Continue Lexapro to 20 mg and  Vyvanse 30 mg. Wille Glaser has a refill left on clonazepam 0.5 mg prn anxietyshould he require one. Next visit in 3 months. The plan was discussed with patient who had an opportunity to ask questions and these were all answered. I spend 25 minutes in phone consultation with the patient.     Stephanie Acre, MD 03/20/2019, 2:16 PM

## 2019-04-02 ENCOUNTER — Ambulatory Visit (INDEPENDENT_AMBULATORY_CARE_PROVIDER_SITE_OTHER): Payer: PRIVATE HEALTH INSURANCE | Admitting: Psychology

## 2019-04-02 DIAGNOSIS — F410 Panic disorder [episodic paroxysmal anxiety] without agoraphobia: Secondary | ICD-10-CM

## 2019-04-09 ENCOUNTER — Other Ambulatory Visit: Payer: Self-pay

## 2019-04-09 ENCOUNTER — Encounter: Payer: Self-pay | Admitting: Family Medicine

## 2019-04-09 ENCOUNTER — Ambulatory Visit (INDEPENDENT_AMBULATORY_CARE_PROVIDER_SITE_OTHER): Payer: PRIVATE HEALTH INSURANCE | Admitting: Family Medicine

## 2019-04-09 VITALS — BP 107/66 | HR 88 | Temp 98.0°F | Ht 69.0 in | Wt 135.0 lb

## 2019-04-09 DIAGNOSIS — M79671 Pain in right foot: Secondary | ICD-10-CM

## 2019-04-09 DIAGNOSIS — F4520 Hypochondriacal disorder, unspecified: Secondary | ICD-10-CM | POA: Diagnosis not present

## 2019-04-09 DIAGNOSIS — R0789 Other chest pain: Secondary | ICD-10-CM | POA: Diagnosis not present

## 2019-04-09 DIAGNOSIS — F419 Anxiety disorder, unspecified: Secondary | ICD-10-CM

## 2019-04-09 DIAGNOSIS — R7309 Other abnormal glucose: Secondary | ICD-10-CM

## 2019-04-09 DIAGNOSIS — F430 Acute stress reaction: Secondary | ICD-10-CM | POA: Diagnosis not present

## 2019-04-09 DIAGNOSIS — E786 Lipoprotein deficiency: Secondary | ICD-10-CM

## 2019-04-09 DIAGNOSIS — F900 Attention-deficit hyperactivity disorder, predominantly inattentive type: Secondary | ICD-10-CM

## 2019-04-09 DIAGNOSIS — F459 Somatoform disorder, unspecified: Secondary | ICD-10-CM | POA: Diagnosis not present

## 2019-04-09 DIAGNOSIS — M79672 Pain in left foot: Secondary | ICD-10-CM

## 2019-04-09 DIAGNOSIS — E162 Hypoglycemia, unspecified: Secondary | ICD-10-CM

## 2019-04-09 DIAGNOSIS — E785 Hyperlipidemia, unspecified: Secondary | ICD-10-CM

## 2019-04-09 DIAGNOSIS — R29898 Other symptoms and signs involving the musculoskeletal system: Secondary | ICD-10-CM

## 2019-04-09 DIAGNOSIS — E559 Vitamin D deficiency, unspecified: Secondary | ICD-10-CM

## 2019-04-09 NOTE — Patient Instructions (Signed)
Please check your blood pressure and pulse especially when going from a sitting to standing position and write these numbers down.  I like you to check it at least once daily or more often if you have more often episodes of dizziness.  We will follow-up in the near future if these symptoms persist and labs are normal  Dizziness Dizziness is a common problem. It is a feeling of unsteadiness or light-headedness. You may feel like you are about to faint. Dizziness can lead to injury if you stumble or fall. Anyone can become dizzy, but dizziness is more common in older adults. This condition can be caused by a number of things, including medicines, dehydration, or illness. Follow these instructions at home: Eating and drinking  Drink enough fluid to keep your urine clear or pale yellow. This helps to keep you from becoming dehydrated. Try to drink more clear fluids, such as water.  Do not drink alcohol.  Limit your caffeine intake if told to do so by your health care provider. Check ingredients and nutrition facts to see if a food or beverage contains caffeine.  Limit your salt (sodium) intake if told to do so by your health care provider. Check ingredients and nutrition facts to see if a food or beverage contains sodium. Activity  Avoid making quick movements. ? Rise slowly from chairs and steady yourself until you feel okay. ? In the morning, first sit up on the side of the bed. When you feel okay, stand slowly while you hold onto something until you know that your balance is fine.  If you need to stand in one place for a long time, move your legs often. Tighten and relax the muscles in your legs while you are standing.  Do not drive or use heavy machinery if you feel dizzy.  Avoid bending down if you feel dizzy. Place items in your home so that they are easy for you to reach without leaning over. Lifestyle  Do not use any products that contain nicotine or tobacco, such as cigarettes and  e-cigarettes. If you need help quitting, ask your health care provider.  Try to reduce your stress level by using methods such as yoga or meditation. Talk with your health care provider if you need help to manage your stress. General instructions  Watch your dizziness for any changes.  Take over-the-counter and prescription medicines only as told by your health care provider. Talk with your health care provider if you think that your dizziness is caused by a medicine that you are taking.  Tell a friend or a family member that you are feeling dizzy. If he or she notices any changes in your behavior, have this person call your health care provider.  Keep all follow-up visits as told by your health care provider. This is important. Contact a health care provider if:  Your dizziness does not go away.  Your dizziness or light-headedness gets worse.  You feel nauseous.  You have reduced hearing.  You have new symptoms.  You are unsteady on your feet or you feel like the room is spinning. Get help right away if:  You vomit or have diarrhea and are unable to eat or drink anything.  You have problems talking, walking, swallowing, or using your arms, hands, or legs.  You feel generally weak.  You are not thinking clearly or you have trouble forming sentences. It may take a friend or family member to notice this.  You have chest pain, abdominal  pain, shortness of breath, or sweating.  Your vision changes.  You have any bleeding.  You have a severe headache.  You have neck pain or a stiff neck.  You have a fever. These symptoms may represent a serious problem that is an emergency. Do not wait to see if the symptoms will go away. Get medical help right away. Call your local emergency services (911 in the [place] Do not drive yourself to the hospital. Summary  Dizziness is a feeling of unsteadiness or light-headedness. This condition can be caused by a number of things, including  medicines, dehydration, or illness.  Anyone can become dizzy, but dizziness is more common in older adults.  Drink enough fluid to keep your urine clear or pale yellow. Do not drink alcohol.  Avoid making quick movements if you feel dizzy. Monitor your dizziness for any changes. This information is not intended to replace advice given to you by your health care provider. Make sure you discuss any questions you have with your health care provider. Document Released: 02/14/2001 Document Revised: 08/24/2017 Document Reviewed: 09/23/2016 Elsevier Patient Education  2020 [employer].

## 2019-04-09 NOTE — Progress Notes (Signed)
F/up OV note:  Impression and Recommendations:    1. Sensation of chest tightness- psychosomatic   2. Acute reaction to situational stress   3. Hypochondriacal disorder, unspecified   4. Psychosomatic disorder   5. Vitamin D deficiency   6. Hyperlipidemia, unspecified hyperlipidemia type   7. Low HDL (under 40)   8. Low glucose level/  Low A1c at 4.7   9. Attention deficit hyperactivity disorder, predominantly inattentive type   10. Anxiety   11. Popping of left temporomandibular joint on opening of jaw   12. Pain in both feet      ADHD - Managed through Psychiatry - Stable at this time.  Patient denies concerns or symptoms. - Patient tolerating meds well without complication.  Denies S-E  - Continue management as prescribed through psychiatry.  - Will continue to monitor.   Mood, Anxiety - Managed through Psychiatry - Stable at this time on current management. However I believe his gad is cause of sx - Patient tolerating meds well without complication.  Denies S-E. - Patient knows to continue to obtain management through Dr. Montel Culver of psychiatry.  - Not taking clonazepam; rarely PRN only as recommended by psych.  - Encouraged patient to think about future life goals to work toward and look forward to.  - Reviewed the "spokes of the wheel" of mood and health management.  Stressed the importance of ongoing prudent habits, including regular exercise, appropriate sleep hygiene, healthful dietary habits, and prayer/meditation to relax.   Vitamin D Deficiency - Lab work drawn today. - Continue management as prescribed.  - Will continue to monitor.   Psychosomatic disorder causing Sensation of Chest Tightness -Extensive education provided regarding how our emotional state can cause physical symptoms.  Patient is extremely skeptical of this today.   --> Has several concerns today and is very difficult to redirect pt  - Advised patient that if he is  experiencing a sensation in the chest that goes away when he does other activities, this is likely due to a psychosomatic response.  Reviewed that this is a common phenomenon that is a by-product of anxiety and stress.  - Deep breathing techniques and meditation discussed as excellent avenues to relieve psychosomatic manifestations of stress.  - Will continue to monitor.   Hypochondriacal disorder -   Periodic Pain of Both Feet,    Popping of left temporomandibular joint on opening of jaw - Patient with many psychosomatic complaints.  - Per pt, he confirms when he distracts himself or does something else, "it all goes away."  - Advised patient that his foot pain may also be a psychosomatic manifestation of stress. - Advised patient to follow-up with dentist regarding jaw concerns.  - Will continue to monitor.   Lifestyle & Preventative Health Maintenance - Advised patient to continue working toward exercising to improve overall mental, physical, and emotional health.    - Healthy dietary habits encouraged, including low-carb, and high amounts of lean protein in diet.  - American Heart Association guidelines for healthy diet, basically Mediterranean diet, and exercise guidelines of 30 minutes 5 days per week or more discussed in detail.  - Patient should also consume adequate amounts of water.  - Health counseling performed.  All questions answered.   Education and routine counseling performed. Handouts provided if pt desired.   Recommendations - Lab work drawn to reassess Vitamin D, Lipid Panel (Hyperlipidemia, Low HDL) and A1c (low A1c at 4.7 last check).  See orders below.  -  He is fasting today.   Positional Dizziness:  - Discussed that reported dizziness may have many causes, including dehydration etc etc. - told pt we will obtain labs today and bring him back to discuss them and address this complaint that he mentions at the end of his very long OV today - Handout  provided today.  - Discussed that if patient continues to feel dizzy when going from sitting to standing, patient should check his blood pressure prior to standing and then afterward.  -  Ambulatory blood pressure and pulse monitoring STRONGLY encouraged.  - If the dizziness does persist, advised patient to return for follow-up in very near future to specifically address his dizziness.   - Follow-up for lab abnormalities, and dizziness PRN.   Pt was interviewed and evaluated by me in the clinic today for 45.5+ minutes, with over 50% time spent in face to face counseling of patients various medical conditions, treatment plans of those medical conditions including medicine management and lifestyle modification, strategies to improve health and well being; and in coordination of care. SEE ABOVE TREATMENT PLAN FOR DETAILS    Orders Placed This Encounter  Procedures  . CBC with Differential/Platelet  . Comprehensive metabolic panel  . Hemoglobin A1c  . Lipid panel  . T4, free  . TSH  . VITAMIN D 25 Hydroxy (Vit-D Deficiency, Fractures)    Return for Follow-up for any lab abnormalities and for evaluation of dizziness if persists.   -Reminded patient the need for yearly complete physical exam office visits in addition to office visits for management of the chronic diseases  -Gross side effects, risk and benefits, and alternatives of medications discussed with patient.  Patient is aware that all medications have potential side effects and we are unable to predict every side effect or drug-drug interaction that may occur.  Expresses verbal understanding and consents to current therapy plan and treatment regimen.   Please see AVS handed out to patient at the end of our visit for further patient instructions/ counseling done pertaining to today's office visit.    Note:  This document was prepared using Dragon voice recognition software and may include unintentional dictation errors.  This  document serves as a record of services personally performed by Mellody Dance, DO. It was created on her behalf by Toni Amend, a trained medical scribe. The creation of this record is based on the scribe's personal observations and the provider's statements to them.   I have reviewed the above medical documentation for accuracy and completeness and I concur.  Mellody Dance, DO 04/11/2019 2:53 PM      ______________________________________________________________________    Subjective:  HPI: Brent Brooks y.o. male  presents for 3 month follow up for evaluation of our treatment plan for pt's ADD/ ADHD.  Mood during COVID - Mood Management through Psychiatry Says he's had historical anxiety issues.  These days, confirms that he's been stable on his treatment plan and feeling okay.  States his anxiety and depression have been "fairly minimal" throughout current events.  He is on Lexapro 20 and denies side-effects.    Confirms that he takes clonazepam only rarely as needed, and hasn't had it refilled in years.  He does attend therapy with a counselor every 3 weeks.  He likes his therapist, Marya Fossa.  States that he likes that his therapist is "hip."  ADHD - Managed through Psychiatry Managed on Vyvanse.  Says this treatment is going well and "at least it doesn't make me feel like  I'm dying."  Feels that his focus has been better since starting Vyvanse.  "I still have issues but I can figure that out with my psychiatrist."  Lifestyle Currently "mainly working around the house."  Notes that the past two weeks, he's been having some issues with his car.  Still lives with his grandmother.  Is not currently working for income.  Was working odd jobs around the neighborhood, "but that's about it."  Says he genuinely doesn't know what he wants to do with his life in terms of life or career goals yet.  States he has two 4 year degrees, one in Chemical engineer and  one in Conservation officer, nature.    States he's not very proud of his degrees "because they didn't take a lot of effort."    Is thinking about further education.    Hobbies In most of his free time he reads, plays video games but not much else.  Has written a bit in the past, but not much.   Diet & Exercise States he would like to get some blood work to see how his diet is going.  Is not engaging in a lot of cardio overall.    Says he gets "plenty of sweat in" with his housework and other tasks at home, but isn't sure how cardiovascular this is.   Occasional (Psychosomatic) Worries about Breathing  Says chronically he feels like he can't take a full breath.  "Doesn't feel like asthma or anything."  "If I pay attention to it, it feels like I can't get a full breath, but if I don't pay attention to it I forget about it."   - When he feels these sx, he does some cardio and then feels fine.  -  Indicates that the sensation is sort of a tightness and he points to his sternum.  States sometimes his head feels heavy and he can "feel his brain."   Occasional Foot Pain Says he's been having issues with his feet.  Notes that this feels like he's "pinching the veins or pinching the muscles" of his feet if he moves in a specific way.   - Says it feels like a "sharp pain" in his feet.   - This occurs most often when he's in bed, when he "lays in bed with his feet out." -  Can perform all ADL,S/ activities he desires to do. -  Only occasionally feels this pain when he's walking around doing yardwork or housework.   Historic Referral to GI Patient did go to the GI doctor a while ago for "randomly waking up horribly nauseous."  States they "found nothing."  He still has concerns about this today.  Not worse and has not changed from prior   Jaw Concerns Says that his jaw pops when he opens and closes his mouth.   He is worried it is "cancer or something bad."  Upon further questioning patient has been  clenching his teeth recently and says "I know where that's coming from, then."   - He has not followed up with a dentist in a while.   Near end of office visit patient complains of:  dizziness on Standing  Feels dizziness when he stands up, "constantly," for a couple months at least.  Says he is often lightheaded when he stands up from working in yard etc.   Problem  Pain in Both Feet  Popping of Left Temporomandibular Joint On Opening of Jaw  Sensation of chest tightness- psychosomatic  GAD 7 : Generalized Anxiety Score 04/09/2019 11/07/2018  Nervous, Anxious, on Edge 0 2  Control/stop worrying 0 2  Worry too much - different things 0 2  Trouble relaxing 0 2  Restless 1 1  Easily annoyed or irritable 0 1  Afraid - awful might happen 0 2  Total GAD 7 Score 1 12  Anxiety Difficulty - Somewhat difficult    Depression screen [facility] 2/9 04/09/2019 11/07/2018 10/08/2018  Decreased Interest 0 1 1  Down, Depressed, Hopeless 0 3 1  PHQ - 2 Score 0 4 2  Altered sleeping 0 2 1  Tired, decreased energy 0 2 1  Change in appetite 0 1 0  Feeling bad or failure about yourself  0 3 2  Trouble concentrating 0 1 2  Moving slowly or fidgety/restless 0 1 0  Suicidal thoughts 0 2 1  PHQ-9 Score 0 16 9  Difficult doing work/chores Not difficult at all Somewhat difficult Somewhat difficult     Wt Readings from Last 3 Encounters:  04/09/19 135 lb (61.2 kg)  11/07/18 145 lb (65.8 kg)  10/08/18 143 lb (64.9 kg)    BMI Readings from Last 3 Encounters:  04/09/19 19.94 kg/m  11/07/18 24.13 kg/m  10/08/18 23.80 kg/m    BP Readings from Last 3 Encounters:  04/09/19 107/66  11/07/18 118/74  10/08/18 121/69     Review of Systems: General:   No F/C, wt loss Pulm:   No DIB, SOB, pleuritic chest pain Card:  No CP, palpitations Abd:  No n/v/d or pain Ext:  No inc edema from baseline   Objective: Physical Exam: BP 107/66   Pulse 88   Temp 98 F (36.7 C)   Ht 5\' 9"  (1.753 m)   Wt 135  lb (61.2 kg)   SpO2 100%   BMI 19.94 kg/m  Body mass index is 19.94 kg/m. General: Well nourished, in no apparent distress. Eyes: PERRLA, EOMs, conjunctiva clr no swelling or erythema Neck: supple Resp: Respiratory effort- normal, ECTA B/L w/o W/R/R  Cardio: RRR w/o MRGs. Skin: Warm, dry without rashes, lesions, ecchymosis.  Neuro: Alert, Oriented Psych: Normal affect, Insight and Judgment appropriate.  Chest: Patient with no sternal or costochondral tenderness to palpation on exam. Feet:  WNL, no swelling, no focal TTP, erythema etc, FROM, b/l foot exam completely normal.   Current Medications:  Current Outpatient Medications on File Prior to Visit  Medication Sig Dispense Refill  . escitalopram (LEXAPRO) 20 MG tablet Take 1 tablet (20 mg total) by mouth daily. 30 tablet 2  . ondansetron (ZOFRAN) 4 MG tablet Take 1 tablet (4 mg total) by mouth as needed for nausea or vomiting. 30 tablet 0  . Vitamin D, Ergocalciferol, (DRISDOL) 1.25 MG (50000 UT) CAPS capsule Take 1 capsule (50,000 Units total) by mouth every 7 (seven) days. 12 capsule 3  . clonazePAM (KLONOPIN) 0.5 MG tablet Take 0.5 mg by mouth 2 (two) times daily as needed for anxiety.     No current facility-administered medications on file prior to visit.     Medical History:  Patient Active Problem List   Diagnosis Date Noted  . Psychosomatic disorder 04/09/2019    Priority: High  . Hyperlipidemia 03/13/2018    Priority: High  . Low glucose level/  Low A1c at 4.7 03/13/2018    Priority: Medium  . Low HDL (under 40) 03/13/2018    Priority: Medium  . Nausea without vomiting 07/05/2016    Priority: Medium  . Chronic headaches  04/03/2016    Priority: Medium  . Vitamin D deficiency 08/09/2017    Priority: Low  . Acute reaction to situational stress 07/05/2016    Priority: Low  . Sleep disorder 04/03/2016    Priority: Low  . Pain in both feet 05/03/2019  . Popping of left temporomandibular joint on opening of jaw  05/03/2019  . Sensation of chest tightness- psychosomatic 05/03/2019  . GAD (generalized anxiety disorder) 11/18/2018  . Adult ADHD 10/08/2018  . Person with feared complaint, no diagnosis made 03/13/2018  . 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 Anxiety 08/08/2017  . Asthma 08/08/2017  . Constipation 08/08/2017  . Depressive disorder 08/08/2017  . Generalized muscle ache 08/07/2017  . Flatulence, eructation, and gas pain 07/05/2016  . Lipoma of forehead 04/03/2016  . 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 Family history of prostate cancer- MGF 04/03/2016  . Family history of depression in MOM, [facility] 04/03/2016  . Family history of suicide MGM 04/03/2016  . FH: migraines- multiple relatives 04/03/2016    Allergies:  Allergies  Allergen Reactions  . Adderall Xr [Amphetamine-Dextroamphet Er]     Rapid heart rate, panic feeling, increased anxiety      Family history-  Reviewed; changed as appropriate  Social history-  Reviewed; changed as appropriate

## 2019-04-10 LAB — COMPREHENSIVE METABOLIC PANEL WITH GFR
ALT: 29 IU/L (ref 0–44)
AST: 27 IU/L (ref 0–40)
Albumin/Globulin Ratio: 2 (ref 1.2–2.2)
Albumin: 5 g/dL (ref 4.1–5.2)
Alkaline Phosphatase: 90 IU/L (ref 39–117)
BUN/Creatinine Ratio: 12 (ref 9–20)
BUN: 11 mg/dL (ref 6–20)
Bilirubin Total: 0.8 mg/dL (ref 0.0–1.2)
CO2: 23 mmol/L (ref 20–29)
Calcium: 9.7 mg/dL (ref 8.7–10.2)
Chloride: 98 mmol/L (ref 96–106)
Creatinine, Ser: 0.95 mg/dL (ref 0.76–1.27)
GFR calc Af Amer: 127 mL/min/1.73
GFR calc non Af Amer: 110 mL/min/1.73
Globulin, Total: 2.5 g/dL (ref 1.5–4.5)
Glucose: 77 mg/dL (ref 65–99)
Potassium: 4.5 mmol/L (ref 3.5–5.2)
Sodium: 140 mmol/L (ref 134–144)
Total Protein: 7.5 g/dL (ref 6.0–8.5)

## 2019-04-10 LAB — CBC WITH DIFFERENTIAL/PLATELET
Basophils Absolute: 0.1 10*3/uL (ref 0.0–0.2)
Basos: 2 %
EOS (ABSOLUTE): 0.2 10*3/uL (ref 0.0–0.4)
Eos: 4 %
Hematocrit: 47.9 % (ref 37.5–51.0)
Hemoglobin: 16.6 g/dL (ref 13.0–17.7)
Immature Grans (Abs): 0 10*3/uL (ref 0.0–0.1)
Immature Granulocytes: 0 %
Lymphocytes Absolute: 2.1 10*3/uL (ref 0.7–3.1)
Lymphs: 33 %
MCH: 30.6 pg (ref 26.6–33.0)
MCHC: 34.7 g/dL (ref 31.5–35.7)
MCV: 88 fL (ref 79–97)
Monocytes Absolute: 0.4 10*3/uL (ref 0.1–0.9)
Monocytes: 7 %
Neutrophils Absolute: 3.4 10*3/uL (ref 1.4–7.0)
Neutrophils: 54 %
Platelets: 241 10*3/uL (ref 150–450)
RBC: 5.43 x10E6/uL (ref 4.14–5.80)
RDW: 12.5 % (ref 11.6–15.4)
WBC: 6.3 10*3/uL (ref 3.4–10.8)

## 2019-04-10 LAB — LIPID PANEL
Chol/HDL Ratio: 3.4 ratio (ref 0.0–5.0)
Cholesterol, Total: 181 mg/dL (ref 100–199)
HDL: 54 mg/dL
LDL Calculated: 106 mg/dL — ABNORMAL HIGH (ref 0–99)
Triglycerides: 105 mg/dL (ref 0–149)
VLDL Cholesterol Cal: 21 mg/dL (ref 5–40)

## 2019-04-10 LAB — VITAMIN D 25 HYDROXY (VIT D DEFICIENCY, FRACTURES): Vit D, 25-Hydroxy: 55 ng/mL (ref 30.0–100.0)

## 2019-04-10 LAB — HEMOGLOBIN A1C
Est. average glucose Bld gHb Est-mCnc: 97 mg/dL
Hgb A1c MFr Bld: 5 % (ref 4.8–5.6)

## 2019-04-10 LAB — TSH: TSH: 0.635 u[IU]/mL (ref 0.450–4.500)

## 2019-04-10 LAB — T4, FREE: Free T4: 1.27 ng/dL (ref 0.82–1.77)

## 2019-04-25 ENCOUNTER — Ambulatory Visit: Payer: PRIVATE HEALTH INSURANCE | Admitting: Psychology

## 2019-04-28 ENCOUNTER — Ambulatory Visit (INDEPENDENT_AMBULATORY_CARE_PROVIDER_SITE_OTHER): Payer: PRIVATE HEALTH INSURANCE | Admitting: Psychology

## 2019-04-28 DIAGNOSIS — F410 Panic disorder [episodic paroxysmal anxiety] without agoraphobia: Secondary | ICD-10-CM | POA: Diagnosis not present

## 2019-05-01 ENCOUNTER — Telehealth (HOSPITAL_COMMUNITY): Payer: Self-pay

## 2019-05-01 ENCOUNTER — Other Ambulatory Visit (HOSPITAL_COMMUNITY): Payer: Self-pay | Admitting: Psychiatry

## 2019-05-01 MED ORDER — LISDEXAMFETAMINE DIMESYLATE 40 MG PO CAPS: 40.0000 mg | ORAL_CAPSULE | Freq: Every day | ORAL | 0 refills | Status: DC

## 2019-05-01 NOTE — Telephone Encounter (Signed)
Done - 40 mg it is.

## 2019-05-01 NOTE — Telephone Encounter (Signed)
Patient is calling for a refill on his Vyvanse, he would like to know if you can increase the dose to 40 mg. Please review and advise, thank you

## 2019-05-03 DIAGNOSIS — M79672 Pain in left foot: Secondary | ICD-10-CM | POA: Insufficient documentation

## 2019-05-03 DIAGNOSIS — R29898 Other symptoms and signs involving the musculoskeletal system: Secondary | ICD-10-CM | POA: Insufficient documentation

## 2019-05-03 DIAGNOSIS — M79671 Pain in right foot: Secondary | ICD-10-CM | POA: Insufficient documentation

## 2019-05-03 DIAGNOSIS — R0789 Other chest pain: Secondary | ICD-10-CM | POA: Insufficient documentation

## 2019-05-26 ENCOUNTER — Other Ambulatory Visit (HOSPITAL_COMMUNITY): Payer: Self-pay | Admitting: Psychiatry

## 2019-05-26 ENCOUNTER — Telehealth (HOSPITAL_COMMUNITY): Payer: Self-pay

## 2019-05-26 MED ORDER — LISDEXAMFETAMINE DIMESYLATE 40 MG PO CAPS: 40.0000 mg | ORAL_CAPSULE | Freq: Every day | ORAL | 0 refills | Status: DC

## 2019-05-26 MED ORDER — ESCITALOPRAM OXALATE 20 MG PO TABS: 20.0000 mg | ORAL_TABLET | Freq: Every day | ORAL | 2 refills | Status: DC

## 2019-05-26 NOTE — Telephone Encounter (Signed)
Patient is calling for a refill on his 2 medications, he follows up next month. Pharmacy is the same.

## 2019-05-26 NOTE — Telephone Encounter (Signed)
Done

## 2019-05-27 ENCOUNTER — Ambulatory Visit (INDEPENDENT_AMBULATORY_CARE_PROVIDER_SITE_OTHER): Payer: PRIVATE HEALTH INSURANCE | Admitting: Psychology

## 2019-05-27 DIAGNOSIS — F411 Generalized anxiety disorder: Secondary | ICD-10-CM | POA: Diagnosis not present

## 2019-06-12 ENCOUNTER — Ambulatory Visit (INDEPENDENT_AMBULATORY_CARE_PROVIDER_SITE_OTHER): Payer: PRIVATE HEALTH INSURANCE | Admitting: Psychology

## 2019-06-12 DIAGNOSIS — F410 Panic disorder [episodic paroxysmal anxiety] without agoraphobia: Secondary | ICD-10-CM | POA: Diagnosis not present

## 2019-06-16 ENCOUNTER — Ambulatory Visit (INDEPENDENT_AMBULATORY_CARE_PROVIDER_SITE_OTHER): Payer: PRIVATE HEALTH INSURANCE | Admitting: Psychiatry

## 2019-06-16 ENCOUNTER — Other Ambulatory Visit: Payer: Self-pay

## 2019-06-16 DIAGNOSIS — F411 Generalized anxiety disorder: Secondary | ICD-10-CM | POA: Diagnosis not present

## 2019-06-16 DIAGNOSIS — F909 Attention-deficit hyperactivity disorder, unspecified type: Secondary | ICD-10-CM

## 2019-06-16 DIAGNOSIS — F459 Somatoform disorder, unspecified: Secondary | ICD-10-CM

## 2019-06-16 MED ORDER — LISDEXAMFETAMINE DIMESYLATE 40 MG PO CAPS: 40.0000 mg | ORAL_CAPSULE | Freq: Every day | ORAL | 0 refills | Status: DC

## 2019-06-16 NOTE — Progress Notes (Signed)
[facility] MD/PA/NP OP Progress Note  06/16/2019 2:06 PM Brent Brooks  MRN:  [id number] Interview was conducted by phone and I verified that I was speaking with the correct person using two identifiers. I discussed the limitations of evaluation and management by telemedicine and  the availability of in person appointments. Patient expressed understanding and agreed to proceed.  Chief Complaint: "I am doing well now".  HPI: 28yo single male with long hx ofADD, depression and mixed anxiety/somatic symptom disorder.He had been also dx with having bipolar disorder but I have doubts if he indeed has one as he is unable to clearly describe any distinct manic/hypomanic episodes. Poor response to Adderall (tachycardia) fair to low dose of Vyvanse. He has never been on antidepressants for depression/anxiety. He hasa hx ofepisodic passive SI, none at this time. Sleep fair (with 25 mg of Benadryl), appetite normal. He admittedly worries a lot about his health (hypochondriasis) although he has no chronic medical conditions. His anxiety appear to have subsided and he has not been using clonazepam much. He has also obsessive tendenciesand possibly compulsions. He still is engaged incleaning ("decluttering") his grandmother's houseand has been doing this (often forsevaral hours a day) for the past 4 months. His tendency to  ruminate over many things has decreased with increase of Lexapro dose. He tolerates 40 mg of Vyvanse well. Anxietyof the panic typepractically resolved - did not need to take clonazepam in weeks.  Visit Diagnosis:    ICD-10-CM   1. GAD (generalized anxiety disorder)  F41.1   2. Adult ADHD  F90.9   3. 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  Past Medical History:  Past Medical History:  Diagnosis Date  . Anxiety   . Asthma   . Attention deficit hyperactivity disorder (ADHD) 04/03/2016  . Bipolar 1 disorder, mixed ([place]) 04/03/2016   Dr  Graylon Gunning- in [place], [place]  . Depression   . Sleep disorder 04/03/2016    Past Surgical History:  Procedure Laterality Date  . ESOPHAGOGASTRODUODENOSCOPY ENDOSCOPY    . WISDOM TOOTH EXTRACTION      Family Psychiatric History: Reviewed.  Family History:  Family History  Problem Relation Age of Onset  . Depression Mother   . Migraines Mother   . Migraines Brother   . Suicidality Maternal Grandmother   . Migraines Maternal Grandmother   . Cancer Maternal Grandfather        prostate  . Cancer Paternal Grandfather        unknown    Social History:  Social History   Socioeconomic History  . Marital status: Single    Spouse name: Not on file  . Number of children: Not on file  . Years of education: Not on file  . Highest education level: Not on file  Occupational History  . Not on file  Social Needs  . Financial resource strain: Not on file  . Food insecurity    Worry: Not on file    Inability: Not on file  . Transportation needs    Medical: Not on file    Non-medical: Not on file  Tobacco Use  . Smoking status: Never Smoker  . Smokeless tobacco: Never Used  Substance and Sexual Activity  . Alcohol use: No  . Drug use: No  . Sexual activity: Not Currently    Birth control/protection: Condom  Lifestyle  . Physical activity    Days per week: Not on file    Minutes per session: Not  on file  . Stress: Not on file  Relationships  . Social Herbalist on phone: Not on file    Gets together: Not on file    Attends religious service: Not on file    Active member of club or organization: Not on file    Attends meetings of clubs or organizations: Not on file    Relationship status: Not on file  Other Topics Concern  . Not on file  Social History Narrative  . Not on file    Allergies:  Allergies  Allergen Reactions  . Adderall Xr [Amphetamine-Dextroamphet Er]     Rapid heart rate, panic feeling, increased anxiety     Metabolic Disorder Labs: Lab  Results  Component Value Date   HGBA1C 5.0 04/09/2019   No results found for: PROLACTIN Lab Results  Component Value Date   CHOL 181 04/09/2019   TRIG 105 04/09/2019   HDL 54 04/09/2019   CHOLHDL 3.4 04/09/2019   LDLCALC 106 (H) 04/09/2019   LDLCALC 119 (H) 03/13/2018   Lab Results  Component Value Date   TSH 0.635 04/09/2019   TSH 0.995 08/07/2017    Therapeutic Level Labs: No results found for: LITHIUM No results found for: VALPROATE No components found for:  CBMZ  Current Medications: Current Outpatient Medications  Medication Sig Dispense Refill  . clonazePAM (KLONOPIN) 0.5 MG tablet Take 0.5 mg by mouth 2 (two) times daily as needed for anxiety.    Marland Kitchen escitalopram (LEXAPRO) 20 MG tablet Take 1 tablet (20 mg total) by mouth daily. 30 tablet 2  . [START ON 06/25/2019] lisdexamfetamine (VYVANSE) 40 MG capsule Take 1 capsule (40 mg total) by mouth daily. 30 capsule 0  . ondansetron (ZOFRAN) 4 MG tablet Take 1 tablet (4 mg total) by mouth as needed for nausea or vomiting. 30 tablet 0  . Vitamin D, Ergocalciferol, (DRISDOL) 1.25 MG (50000 UT) CAPS capsule Take 1 capsule (50,000 Units total) by mouth every 7 (seven) days. 12 capsule 3   No current facility-administered medications for this visit.       Psychiatric Specialty Exam: Review of Systems  All other systems reviewed and are negative.   There were no vitals taken for this visit.There is no height or weight on file to calculate BMI.  General Appearance: NA  Eye Contact:  NA  Speech:  Clear and Coherent and Normal Rate  Volume:  Normal  Mood:  Euthymic  Affect:  NA  Thought Process:  Goal Directed and Linear  Orientation:  Full (Time, Place, and Person)  Thought Content: Logical   Suicidal Thoughts:  No  Homicidal Thoughts:  No  Memory:  Immediate;   Good Recent;   Good Remote;   Good  Judgement:  Good  Insight:  Fair  Psychomotor Activity:  NA  Concentration:  Concentration: Good  Recall:  Good  Fund  of Knowledge: Good  Language: Good  Akathisia:  Negative  Handed:  Right  AIMS (if indicated): not done  Assets:  Communication Skills Desire for Improvement Financial Resources/Insurance Housing Physical Health Talents/Skills  ADL's:  Intact  Cognition: WNL  Sleep:  Good   Screenings: GAD-7     Office Visit from 04/09/2019 in [place] at [facility] Visit from 11/07/2018 in [place] at [facility]  Total GAD-7 Score  1  12    PHQ2-9     Office Visit from 04/09/2019 in [place] at [facility]  Office Visit from 11/07/2018 in [place] at [facility] Visit from 10/08/2018 in [place] at [facility] Visit from 09/11/2018 in [place] at [facility] Visit from 03/13/2018 in [place] at [facility]  PHQ-2 Total Score  0  4  2  2  2   PHQ-9 Total Score  0  16  9  12  8        Assessment and Plan: 28yo single male with long hx ofADD, depression and mixed anxiety/somatic symptom disorder.He had been also dx with having bipolar disorder but I have doubts if he indeed has one as he is unable to clearly describe any distinct manic/hypomanic episodes. Poor response to Adderall (tachycardia) fair to low dose of Vyvanse. He has never been on antidepressants for depression/anxiety. He hasa hx ofepisodic passive SI, none at this time. Sleep fair (with 25 mg of Benadryl), appetite normal. He admittedly worries a lot about his health (hypochondriasis) although he has no chronic medical conditions. His anxiety appear to have subsided and he has not been using clonazepam much. He has also obsessive tendenciesand possibly compulsions. He still is engaged incleaning ("decluttering") his grandmother's houseand has been doing this (often forsevaral hours a day) for the past 4 months. His tendency to  ruminate over many things has decreased with increase of Lexapro dose. He tolerates  40 mg of Vyvanse well. Anxietyof the panic typepractically resolved - did not need to take clonazepam in weeks.   Dx: Adult ADHD; GAD  Plan: Continue Lexaproto 20mg and Vyvanse 40 mg. Wille Glaser stated that he does not need a refill on clonazepam 0.5 mg prn anxietyshould he require one. Next visit in16months. The plan was discussed with patient who had an opportunity to ask questions and these were all answered. I spend 25 minutes in phone consultation with the patient.     Stephanie Acre, MD 06/16/2019, 2:06 PM

## 2019-06-19 ENCOUNTER — Other Ambulatory Visit: Payer: Self-pay

## 2019-06-19 ENCOUNTER — Encounter: Payer: Self-pay | Admitting: Family Medicine

## 2019-06-19 ENCOUNTER — Ambulatory Visit (INDEPENDENT_AMBULATORY_CARE_PROVIDER_SITE_OTHER): Payer: PRIVATE HEALTH INSURANCE | Admitting: Family Medicine

## 2019-06-19 VITALS — BP 119/76 | HR 99 | Temp 98.3°F | Resp 18 | Ht 69.0 in | Wt 141.2 lb

## 2019-06-19 DIAGNOSIS — M25532 Pain in left wrist: Secondary | ICD-10-CM

## 2019-06-19 DIAGNOSIS — M25531 Pain in right wrist: Secondary | ICD-10-CM

## 2019-06-19 DIAGNOSIS — X503XXA Overexertion from repetitive movements, initial encounter: Secondary | ICD-10-CM | POA: Diagnosis not present

## 2019-06-19 NOTE — Progress Notes (Signed)
Impression and Recommendations:    1. Wrist pain, right   2. Wrist pain, left   3. Overuse injury     Bilateral Wrist Pain, Overuse Injury - R worse than L - Discussed possible causes of patient sx today - Reviewed sx at length during physical examination. - Education provided and all questions answered. - Discussed need to obtain X-ray of area of concern on right wrist.  See orders. - To help alleviate symptoms, recommended prudent use of splint/ brace that limits ext/flex wrist.  Showed him example of one to buy - Advised patient of types of braces to obtain and implement.  - Advised patient to avoid aggravating activities and other triggers of pain.  - Patient knows to avoid placing pressure on the wrist extensor/flexor fascia as discussed.  - Educational handouts provided.  - For pain, advised prudent use of ice for 7-10 days to help the tissues to relax.  Prn NSAIDS.  - Will continue to monitor.   Orders Placed This Encounter  Procedures  . DG Wrist Navic Only Right  . DG Wrist Complete Right    Gross side effects, risk and benefits, and alternatives of medications and treatment plan in general discussed with patient.  Patient is aware that all medications have potential side effects and we are unable to predict every side effect or drug-drug interaction that may occur.   Patient will call with any questions prior to using medication if they have concerns.    Expresses verbal understanding and consents to current therapy and treatment regimen.  No barriers to understanding were identified.  Red flag symptoms and signs discussed in detail.  Patient expressed understanding regarding what to do in case of emergency\urgent symptoms  Please see AVS handed out to patient at the end of our visit for further patient instructions/ counseling done pertaining to today's office visit.   Return if symptoms worsen or fail to improve, for chronic care as scheduled.     Note:   This note was prepared with assistance of Dragon voice recognition software. Occasional wrong-word or sound-a-like substitutions may have occurred due to the inherent limitations of voice recognition software.  This document serves as a record of services personally performed by Mellody Dance, DO. It was created on her behalf by Toni Amend, a trained medical scribe. The creation of this record is based on the scribe's personal observations and the provider's statements to them.   I have reviewed the above medical documentation for accuracy and completeness and I concur.  Mellody Dance, DO 06/21/2019 4:48 PM        --------------------------------------------------------------------------------------------------------------------------------------------------------------------------------------------------------------------------------------------    Subjective:     HPI: Brent Brooks is a 28 y.o. male who presents to [facility] today for issues as discussed below.  - Bilateral Wrist Pain  Onset: Notes he overworked his bilateral wrists a couple of months ago.  Notes he was doing a lot of handiwork.  Says he was painting, pressure washing lifting, lifting himself up, etc.  Says "I can't put pressure on them, like I can't do a pushup."  Per patient, his right hand is the dominant hand, and his right wrist has the most pain.  Worse with: Notes the pain usually caused "mostly by extending."  If he doesn't extend his wrists, there is no pain.  Denies pain in the middle of the night unless his wrists are extended.  Denies numbness or tingling.  Denies pain shooting up  the arm into the elbow.  Says his left wrist can "take more" than the right wrist.  The area does still hurt with forced extension.  He has never had this pain before.  Has not tried Advil, alleve, or tylenol to alleviate pain.  He has tried bracing the right wrist; is  wearing a soft neoprene sleeve on his right wrist today.  He doesn't think that the wrist brace has helped much with pain.  Over the past weeks, his pain has not worsened, but states it has not improved.    Wt Readings from Last 3 Encounters:  06/19/19 141 lb 3.2 oz (64 kg)  04/09/19 135 lb (61.2 kg)  11/07/18 145 lb (65.8 kg)   BP Readings from Last 3 Encounters:  06/19/19 119/76  04/09/19 107/66  11/07/18 118/74   Pulse Readings from Last 3 Encounters:  06/19/19 99  04/09/19 88  11/07/18 94   BMI Readings from Last 3 Encounters:  06/19/19 20.85 kg/m  04/09/19 19.94 kg/m  11/07/18 24.13 kg/m     Patient Care Team    Relationship Specialty Notifications Start End  Mellody Dance, DO PCP - General Family Medicine  04/03/16   Wilford Corner, MD Consulting Physician Gastroenterology  08/07/17   Stephanie Acre, MD Consulting Physician Psychiatry  04/09/19    Comment: Gives patient his psych and ADHD medications     Patient Active Problem List   Diagnosis Date Noted  . Psychosomatic disorder 04/09/2019    Priority: High  . Hyperlipidemia 03/13/2018    Priority: High  . Low glucose level/  Low A1c at 4.7 03/13/2018    Priority: Medium  . Low HDL (under 40) 03/13/2018    Priority: Medium  . Nausea without vomiting 07/05/2016    Priority: Medium  . Chronic headaches 04/03/2016    Priority: Medium  . Vitamin D deficiency 08/09/2017    Priority: Low  . Acute reaction to situational stress 07/05/2016    Priority: Low  . Sleep disorder 04/03/2016    Priority: Low  . Pain in both feet 05/03/2019  . Popping of left temporomandibular joint on opening of jaw 05/03/2019  . Sensation of chest tightness- psychosomatic 05/03/2019  . GAD (generalized anxiety disorder) 11/18/2018  . Adult ADHD 10/08/2018  . Person with feared complaint, no diagnosis made 03/13/2018  . 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 Anxiety 08/08/2017  . Asthma 08/08/2017  .  Constipation 08/08/2017  . Depressive disorder 08/08/2017  . Generalized muscle ache 08/07/2017  . Flatulence, eructation, and gas pain 07/05/2016  . Lipoma of forehead 04/03/2016  . 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 Family history of prostate cancer- [facility] 04/03/2016  . Family history of depression in MOM, [facility] 04/03/2016  . Family history of suicide [facility] 04/03/2016  . FH: migraines- multiple relatives 04/03/2016    Past Medical history, Surgical history, Family history, Social history, Allergies and Medications have been entered into the medical record, reviewed and changed as needed.    Current Meds  Medication Sig  . clonazePAM (KLONOPIN) 0.5 MG tablet Take 0.5 mg by mouth 2 (two) times daily as needed for anxiety.  Marland Kitchen escitalopram (LEXAPRO) 20 MG tablet Take 1 tablet (20 mg total) by mouth daily.  Derrill Memo ON 06/25/2019] lisdexamfetamine (VYVANSE) 40 MG capsule Take 1 capsule (40 mg total) by mouth daily.  . ondansetron (ZOFRAN) 4 MG tablet Take 1 tablet (4 mg total) by mouth as needed for nausea or vomiting.  Marland Kitchen  Vitamin D, Ergocalciferol, (DRISDOL) 1.25 MG (50000 UT) CAPS capsule Take 1 capsule (50,000 Units total) by mouth every 7 (seven) days.    Allergies:  Allergies  Allergen Reactions  . Adderall Xr [Amphetamine-Dextroamphet Er]     Rapid heart rate, panic feeling, increased anxiety      Review of Systems:  A fourteen system review of systems was performed and found to be positive as per HPI.   Objective:   Blood pressure 119/76, pulse 99, temperature 98.3 F (36.8 C), resp. rate 18, height 5\' 9"  (1.753 m), weight 141 lb 3.2 oz (64 kg), SpO2 99 %. Body mass index is 20.85 kg/m. General:  Well Developed, well nourished, appropriate for stated age.  Neuro:  Alert and oriented,  extra-ocular muscles intact  HEENT:  Normocephalic, atraumatic, neck supple, no carotid bruits appreciated  Skin:  no gross rash, warm, pink.  Cardiac:  RRR, S1 S2 Respiratory:  ECTA B/L and A/P, Not using accessory muscles, speaking in full sentences- unlabored. Vascular:  Ext warm, no cyanosis apprec.; cap RF less 2 sec. Psych:  No HI/SI, judgement and insight good, Euthymic mood. Full Affect. Wrists:  Patient wearing soft neoprene sleeve on his right wrist.  Snuffbox tenderness- right wrist.  No tenderness to distal ulna or radius on right arm, and no tenderness to resistive supination and pronation of the wrist.  Pain on resistive extension of the wrist, and none on resistive flexion of the wrist. Thenar eminence and webspace between thumb and first digit with soft tissue tenderness to deep palpation.  FROM fingers, wrist, elbow, 5/5 strength and equal b/l;  NVI b/l  Left wrist findings exactly the same as right except there is no snuffbox tenderness on the left.

## 2019-06-19 NOTE — Patient Instructions (Signed)
Wrist Pain, Adult There are many things that can cause wrist pain. Some common causes include:  An injury to the wrist area, such as a sprain, strain, or fracture.  Overuse of the joint.  A condition that causes increased pressure on a nerve in the wrist (carpal tunnel syndrome).  Wear and tear of the joints that occurs with aging (osteoarthritis).  A variety of other types of arthritis. Sometimes, the cause of wrist pain is not known. Often, the pain goes away when you follow instructions from your health care provider for relieving pain at home, such as resting or icing the wrist. If your wrist pain continues, it is important to tell your health care provider. Follow these instructions at home:  Rest the wrist area for at least 48 hours or as long as told by your health care provider.  If a splint or elastic bandage has been applied, use it as told by your health care provider. ? Remove the splint or bandage only as told by your health care provider. ? Loosen the splint or bandage if your fingers tingle, become numb, or turn cold or blue.  If directed, apply ice to the injured area. ? If you have a removable splint or elastic bandage, remove it as told by your health care provider. ? Put ice in a plastic bag. ? Place a towel between your skin and the bag or between your splint or bandage and the bag. ? Leave the ice on for 20 minutes, 2-3 times a day.   Keep your arm raised (elevated) above the level of your heart while you are sitting or lying down.  Take over-the-counter and prescription medicines only as told by your health care provider.  Keep all follow-up visits as told by your health care provider. This is important. Contact a health care provider if:  You have a sudden sharp pain in the wrist, hand, or arm that is different or new.  The swelling or bruising on your wrist or hand gets worse.  Your skin becomes red, gets a rash, or has open sores.  Your pain does not  get better or it gets worse. Get help right away if:  You lose feeling in your fingers or hand.  Your fingers turn white, very red, or cold and blue.  You cannot move your fingers.  You have a fever or chills. This information is not intended to replace advice given to you by your health care provider. Make sure you discuss any questions you have with your health care provider. Document Released: 05/31/2005 Document Revised: 08/03/2017 Document Reviewed: 03/09/2016 Elsevier Patient Education  2020 [employer]  

## 2019-07-01 ENCOUNTER — Ambulatory Visit (INDEPENDENT_AMBULATORY_CARE_PROVIDER_SITE_OTHER): Payer: PRIVATE HEALTH INSURANCE | Admitting: Psychology

## 2019-07-01 DIAGNOSIS — F410 Panic disorder [episodic paroxysmal anxiety] without agoraphobia: Secondary | ICD-10-CM

## 2019-07-18 ENCOUNTER — Telehealth: Payer: Self-pay | Admitting: Family Medicine

## 2019-07-18 NOTE — Telephone Encounter (Signed)
Patient called states @ last OV provider ordered him to have Rt wrist Victorino Dike has heard nothing from Imaging Dept regarding Appt & wonders if someone can call him back w/ update.  --Forwarding message to [facility] for review & follow-up.  --glh

## 2019-07-22 ENCOUNTER — Ambulatory Visit (INDEPENDENT_AMBULATORY_CARE_PROVIDER_SITE_OTHER): Payer: PRIVATE HEALTH INSURANCE | Admitting: Psychology

## 2019-07-22 DIAGNOSIS — F410 Panic disorder [episodic paroxysmal anxiety] without agoraphobia: Secondary | ICD-10-CM

## 2019-07-25 ENCOUNTER — Ambulatory Visit
Admission: RE | Admit: 2019-07-25 | Discharge: 2019-07-25 | Disposition: A | Discharge status: Home / Self Care | Payer: PRIVATE HEALTH INSURANCE | Source: Ambulatory Visit | Attending: Family Medicine | Admitting: Family Medicine

## 2019-07-25 ENCOUNTER — Other Ambulatory Visit: Payer: Self-pay

## 2019-07-25 DIAGNOSIS — M25531 Pain in right wrist: Secondary | ICD-10-CM

## 2019-07-28 ENCOUNTER — Telehealth (HOSPITAL_COMMUNITY): Payer: Self-pay | Admitting: *Deleted

## 2019-07-28 ENCOUNTER — Other Ambulatory Visit (HOSPITAL_COMMUNITY): Payer: Self-pay | Admitting: Psychiatry

## 2019-07-28 MED ORDER — LISDEXAMFETAMINE DIMESYLATE 40 MG PO CAPS: 40.0000 mg | ORAL_CAPSULE | Freq: Every day | ORAL | 0 refills | Status: DC

## 2019-07-28 NOTE — Telephone Encounter (Signed)
Done

## 2019-07-28 NOTE — Telephone Encounter (Signed)
Pt left message requesting refill of his VyVanse. Has an appointment 09/16/19.

## 2019-08-14 ENCOUNTER — Ambulatory Visit (INDEPENDENT_AMBULATORY_CARE_PROVIDER_SITE_OTHER): Payer: PRIVATE HEALTH INSURANCE | Admitting: Psychology

## 2019-08-14 DIAGNOSIS — F410 Panic disorder [episodic paroxysmal anxiety] without agoraphobia: Secondary | ICD-10-CM | POA: Diagnosis not present

## 2019-08-26 ENCOUNTER — Other Ambulatory Visit (HOSPITAL_COMMUNITY): Payer: Self-pay | Admitting: Psychiatry

## 2019-08-26 ENCOUNTER — Telehealth (HOSPITAL_COMMUNITY): Payer: Self-pay | Admitting: *Deleted

## 2019-08-26 MED ORDER — LISDEXAMFETAMINE DIMESYLATE 40 MG PO CAPS: 40.0000 mg | ORAL_CAPSULE | Freq: Every day | ORAL | 0 refills | Status: DC

## 2019-08-26 MED ORDER — ESCITALOPRAM OXALATE 20 MG PO TABS: 20.0000 mg | ORAL_TABLET | Freq: Every day | ORAL | 0 refills | Status: DC

## 2019-08-26 NOTE — Telephone Encounter (Signed)
Done

## 2019-08-26 NOTE — Telephone Encounter (Signed)
Patient called asking for refills of Lexapro and Vyvanse. Chart reviewed, refills appropriate. Next appointment 09/16/19.

## 2019-09-09 ENCOUNTER — Ambulatory Visit: Payer: PRIVATE HEALTH INSURANCE | Admitting: Psychology

## 2019-09-16 ENCOUNTER — Ambulatory Visit (HOSPITAL_COMMUNITY): Payer: PRIVATE HEALTH INSURANCE | Admitting: Psychiatry

## 2019-09-25 ENCOUNTER — Other Ambulatory Visit (HOSPITAL_COMMUNITY): Payer: Self-pay | Admitting: Psychiatry

## 2019-09-25 ENCOUNTER — Telehealth (HOSPITAL_COMMUNITY): Payer: Self-pay | Admitting: *Deleted

## 2019-09-25 MED ORDER — ESCITALOPRAM OXALATE 20 MG PO TABS: 20.0000 mg | ORAL_TABLET | Freq: Every day | ORAL | 2 refills | Status: DC

## 2019-09-25 MED ORDER — LISDEXAMFETAMINE DIMESYLATE 40 MG PO CAPS: 40.0000 mg | ORAL_CAPSULE | Freq: Every day | ORAL | 0 refills | Status: DC

## 2019-09-25 NOTE — Telephone Encounter (Signed)
Pt left message requesting refill on Lexapro and VyVanse. Pt has an upcoming appointment on 12/08/19. Please review.

## 2019-09-25 NOTE — Telephone Encounter (Signed)
Done

## 2019-10-15 ENCOUNTER — Ambulatory Visit (INDEPENDENT_AMBULATORY_CARE_PROVIDER_SITE_OTHER): Payer: PRIVATE HEALTH INSURANCE | Admitting: Family Medicine

## 2019-10-15 ENCOUNTER — Other Ambulatory Visit: Payer: Self-pay

## 2019-10-15 ENCOUNTER — Encounter: Payer: Self-pay | Admitting: Family Medicine

## 2019-10-15 VITALS — BP 112/74 | HR 101 | Temp 97.7°F | Resp 10 | Ht 69.0 in | Wt 153.1 lb

## 2019-10-15 DIAGNOSIS — Z23 Encounter for immunization: Secondary | ICD-10-CM | POA: Diagnosis not present

## 2019-10-15 DIAGNOSIS — Z719 Counseling, unspecified: Secondary | ICD-10-CM | POA: Diagnosis not present

## 2019-10-15 DIAGNOSIS — Z0000 Encounter for general adult medical examination without abnormal findings: Secondary | ICD-10-CM | POA: Diagnosis not present

## 2019-10-15 NOTE — Patient Instructions (Signed)
Brent Brooks, your labs look so good back on 8-5 of 2020 that you do not need any repeated at this time.  Your cholesterol was going down, vitamin D was perfect and everything was actually improving and or within normal limits. -I recommend this be repeated around 04/08/2020   Preventive Care for Adults, Male A healthy lifestyle and preventive care can promote health and wellness. Preventive health guidelines for men include the following key practices:  A routine yearly physical is a good way to check with your health care provider about your health and preventative screening. It is a chance to share any concerns and updates on your health and to receive a thorough exam.  Visit your dentist for a routine exam and preventative care every 6 months. Brush your teeth twice a day and floss once a day. Good oral hygiene prevents tooth decay and gum disease.  The frequency of eye exams is based on your age, health, family medical history, use of contact lenses, and other factors. Follow your health care provider's recommendations for frequency of eye exams.  Eat a healthy diet. Foods such as vegetables, fruits, whole grains, low-fat dairy products, and lean protein foods contain the nutrients you need without too many calories. Decrease your intake of foods high in solid fats, added sugars, and salt. Eat the right amount of calories for you. Get information about a proper diet from your health care provider, if necessary.  Regular physical exercise is one of the most important things you can do for your health. Most adults should get at least 150 minutes of moderate-intensity exercise (any activity that increases your heart rate and causes you to sweat) each week. In addition, most adults need muscle-strengthening exercises on 2 or more days a week.  Maintain a healthy weight. The body mass index (BMI) is a screening tool to identify possible weight problems. It provides an estimate of body fat based on height and  weight. Your health care provider can find your BMI and can help you achieve or maintain a healthy weight. For adults 20 years and older:  A BMI below 18.5 is considered underweight.  A BMI of 18.5 to 24.9 is normal.  A BMI of 25 to 29.9 is considered overweight.  A BMI of 30 and above is considered obese.  Maintain normal blood lipids and cholesterol levels by exercising and minimizing your intake of saturated fat. Eat a balanced diet with plenty of fruit and vegetables. Blood tests for lipids and cholesterol should begin at age 45 and be repeated every 5 years. If your lipid or cholesterol levels are high, you are over 50, or you are at high risk for heart disease, you may need your cholesterol levels checked more frequently. Ongoing high lipid and cholesterol levels should be treated with medicines if diet and exercise are not working.  If you smoke, find out from your health care provider how to quit. If you do not use tobacco, do not start.  Lung cancer screening is recommended for adults aged 45-80 years who are at high risk for developing lung cancer because of a history of smoking. A yearly low-dose CT scan of the lungs is recommended for people who have at least a 30-pack-year history of smoking and are a current smoker or have quit within the past 15 years. A pack year of smoking is smoking an average of 1 pack of cigarettes a day for 1 year (for example: 1 pack a day for 30 years or 2  packs a day for 15 years). Yearly screening should continue until the smoker has stopped smoking for at least 15 years. Yearly screening should be stopped for people who develop a health problem that would prevent them from having lung cancer treatment.  If you choose to drink alcohol, do not have more than 2 drinks per day. One drink is considered to be 12 ounces (355 mL) of beer, 5 ounces (148 mL) of wine, or 1.5 ounces (44 mL) of liquor.  Avoid use of street drugs. Do not share needles with anyone. Ask  for help if you need support or instructions about stopping the use of drugs.  High blood pressure causes heart disease and increases the risk of stroke. Your blood pressure should be checked at least every 1-2 years. Ongoing high blood pressure should be treated with medicines, if weight loss and exercise are not effective.  If you are 16-22 years old, ask your health care provider if you should take aspirin to prevent heart disease.  Diabetes screening is done by taking a blood sample to check your blood glucose level after you have not eaten for a certain period of time (fasting). If you are not overweight and you do not have risk factors for diabetes, you should be screened once every 3 years starting at age 32. If you are overweight or obese and you are 5-64 years of age, you should be screened for diabetes every year as part of your cardiovascular risk assessment.  Colorectal cancer can be detected and often prevented. Most routine colorectal cancer screening begins at the age of 67 and continues through age 3. However, your health care provider may recommend screening at an earlier age if you have risk factors for colon cancer. On a yearly basis, your health care provider may provide home test kits to check for hidden blood in the stool. Use of a small camera at the end of a tube to directly examine the colon (sigmoidoscopy or colonoscopy) can detect the earliest forms of colorectal cancer. Talk to your health care provider about this at age 73, when routine screening begins. Direct exam of the colon should be repeated every 5-10 years through age 35, unless early forms of precancerous polyps or small growths are found.  People who are at an increased risk for hepatitis B should be screened for this virus. You are considered at high risk for hepatitis B if:  You were born in a country where hepatitis B occurs often. Talk with your health care provider about which countries are considered high  risk.  Your parents were born in a high-risk country and you have not received a shot to protect against hepatitis B (hepatitis B vaccine).  You have HIV or AIDS.  You use needles to inject street drugs.  You live with, or have sex with, someone who has hepatitis B.  You are a man who has sex with other men (MSM).  You get hemodialysis treatment.  You take certain medicines for conditions such as cancer, organ transplantation, and autoimmune conditions.  Hepatitis C blood testing is recommended for all people born from [birth year] through 1965 and any individual with known risks for hepatitis C.  Practice safe sex. Use condoms and avoid high-risk sexual practices to reduce the spread of sexually transmitted infections (STIs). STIs include gonorrhea, chlamydia, syphilis, trichomonas, herpes, HPV, and human immunodeficiency virus (HIV). Herpes, HIV, and HPV are viral illnesses that have no cure. They can result in disability, cancer, and death.  If you are a man who has sex with other men, you should be screened at least once per year for:  HIV.  Urethral, rectal, and pharyngeal infection of gonorrhea, chlamydia, or both.  If you are at risk of being infected with HIV, it is recommended that you take a prescription medicine daily to prevent HIV infection. This is called preexposure prophylaxis (PrEP). You are considered at risk if:  You are a man who has sex with other men (MSM) and have other risk factors.  You are a heterosexual man, are sexually active, and are at increased risk for HIV infection.  You take drugs by injection.  You are sexually active with a partner who has HIV.  Talk with your health care provider about whether you are at high risk of being infected with HIV. If you choose to begin PrEP, you should first be tested for HIV. You should then be tested every 3 months for as long as you are taking PrEP.  A one-time screening for abdominal aortic aneurysm (AAA) and  surgical repair of large AAAs by ultrasound are recommended for men ages 21 to 93 years who are current or former smokers.  Healthy men should no longer receive prostate-specific antigen (PSA) blood tests as part of routine cancer screening. Talk with your health care provider about prostate cancer screening.  Testicular cancer screening is not recommended for adult males who have no symptoms. Screening includes self-exam, a health care provider exam, and other screening tests. Consult with your health care provider about any symptoms you have or any concerns you have about testicular cancer.  Use sunscreen. Apply sunscreen liberally and repeatedly throughout the day. You should seek shade when your shadow is shorter than you. Protect yourself by wearing long sleeves, pants, a wide-brimmed hat, and sunglasses year round, whenever you are outdoors.  Once a month, do a whole-body skin exam, using a mirror to look at the skin on your back. Tell your health care provider about new moles, moles that have irregular borders, moles that are larger than a pencil eraser, or moles that have changed in shape or color.  Stay current with required vaccines (immunizations).  Influenza vaccine. All adults should be immunized every year.  Tetanus, diphtheria, and acellular pertussis (Td, Tdap) vaccine. An adult who has not previously received Tdap or who does not know his vaccine status should receive 1 dose of Tdap. This initial dose should be followed by tetanus and diphtheria toxoids (Td) booster doses every 10 years. Adults with an unknown or incomplete history of completing a 3-dose immunization series with Td-containing vaccines should begin or complete a primary immunization series including a Tdap dose. Adults should receive a Td booster every 10 years.  Varicella vaccine. An adult without evidence of immunity to varicella should receive 2 doses or a second dose if he has previously received 1 dose.  Human  papillomavirus (HPV) vaccine. Males aged 11-21 years who have not received the vaccine previously should receive the 3-dose series. Males aged 22-26 years may be immunized. Immunization is recommended through the age of 52 years for any male who has sex with males and did not get any or all doses earlier. Immunization is recommended for any person with an immunocompromised condition through the age of 32 years if he did not get any or all doses earlier. During the 3-dose series, the second dose should be obtained 4-8 weeks after the first dose. The third dose should be obtained 24 weeks after  the first dose and 16 weeks after the second dose.  Zoster vaccine. One dose is recommended for adults aged 17 years or older unless certain conditions are present.  Measles, mumps, and rubella (MMR) vaccine. Adults born before [birth year] generally are considered immune to measles and mumps. Adults born in [birth year] or later should have 1 or more doses of MMR vaccine unless there is a contraindication to the vaccine or there is laboratory evidence of immunity to each of the three diseases. A routine second dose of MMR vaccine should be obtained at least 28 days after the first dose for students attending postsecondary schools, health care workers, or international travelers. People who received inactivated measles vaccine or an unknown type of measles vaccine during 1963-1967 should receive 2 doses of MMR vaccine. People who received inactivated mumps vaccine or an unknown type of mumps vaccine before 1979 and are at high risk for mumps infection should consider immunization with 2 doses of MMR vaccine. Unvaccinated health care workers born before [birth year] who lack laboratory evidence of measles, mumps, or rubella immunity or laboratory confirmation of disease should consider measles and mumps immunization with 2 doses of MMR vaccine or rubella immunization with 1 dose of MMR vaccine.  Pneumococcal 13-valent conjugate (PCV13) vaccine.  When indicated, a person who is uncertain of his immunization history and has no record of immunization should receive the PCV13 vaccine. All adults 57 years of age and older should receive this vaccine. An adult aged 51 years or older who has certain medical conditions and has not been previously immunized should receive 1 dose of PCV13 vaccine. This PCV13 should be followed with a dose of pneumococcal polysaccharide (PPSV23) vaccine. Adults who are at high risk for pneumococcal disease should obtain the PPSV23 vaccine at least 8 weeks after the dose of PCV13 vaccine. Adults older than 27 years of age who have normal immune system function should obtain the PPSV23 vaccine dose at least 1 year after the dose of PCV13 vaccine.  Pneumococcal polysaccharide (PPSV23) vaccine. When PCV13 is also indicated, PCV13 should be obtained first. All adults aged 65 years and older should be immunized. An adult younger than age 44 years who has certain medical conditions should be immunized. Any person who resides in a nursing home or long-term care facility should be immunized. An adult smoker should be immunized. People with an immunocompromised condition and certain other conditions should receive both PCV13 and PPSV23 vaccines. People with human immunodeficiency virus (HIV) infection should be immunized as soon as possible after diagnosis. Immunization during chemotherapy or radiation therapy should be avoided. Routine use of PPSV23 vaccine is not recommended for American Indians, [place]atives, or people younger than 65 years unless there are medical conditions that require PPSV23 vaccine. When indicated, people who have unknown immunization and have no record of immunization should receive PPSV23 vaccine. One-time revaccination 5 years after the first dose of PPSV23 is recommended for people aged 19-64 years who have chronic kidney failure, nephrotic syndrome, asplenia, or immunocompromised conditions. People who received  1-2 doses of PPSV23 before age 38 years should receive another dose of PPSV23 vaccine at age 100 years or later if at least 5 years have passed since the previous dose. Doses of PPSV23 are not needed for people immunized with PPSV23 at or after age 52 years.  Meningococcal vaccine. Adults with asplenia or persistent complement component deficiencies should receive 2 doses of quadrivalent meningococcal conjugate (MenACWY-D) vaccine. The doses should be obtained at least 2 months  apart. Microbiologists working with certain meningococcal bacteria, [place] recruits, people at risk during an outbreak, and people who travel to or live in countries with a high rate of meningitis should be immunized. A first-year college student up through age 108 years who is living in a residence hall should receive a dose if he did not receive a dose on or after his 16th birthday. Adults who have certain high-risk conditions should receive one or more doses of vaccine.  Hepatitis A vaccine. Adults who wish to be protected from this disease, have chronic liver disease, work with hepatitis A-infected animals, work in hepatitis A research labs, or travel to or work in countries with a high rate of hepatitis A should be immunized. Adults who were previously unvaccinated and who anticipate close contact with an international adoptee during the first 60 days after arrival in the [place] from a country with a high rate of hepatitis A should be immunized.  Hepatitis B vaccine. Adults should be immunized if they wish to be protected from this disease, are under age 89 years and have diabetes, have chronic liver disease, have had more than one sex partner in the past 6 months, may be exposed to blood or other infectious body fluids, are household contacts or sex partners of hepatitis B positive people, are clients or workers in certain care facilities, or travel to or work in countries with a high rate of hepatitis B.  Haemophilus  influenzae type b (Hib) vaccine. A previously unvaccinated person with asplenia or sickle cell disease or having a scheduled splenectomy should receive 1 dose of Hib vaccine. Regardless of previous immunization, a recipient of a hematopoietic stem cell transplant should receive a 3-dose series 6-12 months after his successful transplant. Hib vaccine is not recommended for adults with HIV infection. Preventive Service / Frequency Ages 68 to 33  Blood pressure check.** / Every 3-5 years.  Lipid and cholesterol check.** / Every 5 years beginning at age 75.  Hepatitis C blood test.** / For any individual with known risks for hepatitis C.  Skin self-exam. / Monthly.  Influenza vaccine. / Every year.  Tetanus, diphtheria, and acellular pertussis (Tdap, Td) vaccine.** / Consult your health care provider. 1 dose of Td every 10 years.  Varicella vaccine.** / Consult your health care provider.  HPV vaccine. / 3 doses over 6 months, if 37 or younger.  Measles, mumps, rubella (MMR) vaccine.** / You need at least 1 dose of MMR if you were born in 1957 or later. You may also need a second dose.  Pneumococcal 13-valent conjugate (PCV13) vaccine.** / Consult your health care provider.  Pneumococcal polysaccharide (PPSV23) vaccine.** / 1 to 2 doses if you smoke cigarettes or if you have certain conditions.  Meningococcal vaccine.** / 1 dose if you are age 64 to 11 years and a [employer] researcher living in a residence hall, or have one of several medical conditions. You may also need additional booster doses.  Hepatitis A vaccine.** / Consult your health care provider.  Hepatitis B vaccine.** / Consult your health care provider.  Haemophilus influenzae type b (Hib) vaccine.** / Consult your health care provider. Ages 23 to 12  Blood pressure check.** / Every year.  Lipid and cholesterol check.** / Every 5 years beginning at age 32.  Lung cancer screening. / Every year if you are aged  7-80 years and have a 30-pack-year history of smoking and currently smoke or have quit within the past 15 years. Yearly screening  is stopped once you have quit smoking for at least 15 years or develop a health problem that would prevent you from having lung cancer treatment.  Fecal occult blood test (FOBT) of stool. / Every year beginning at age 29 and continuing until age 35. You may not have to do this test if you get a colonoscopy every 10 years.  Flexible sigmoidoscopy** or colonoscopy.** / Every 5 years for a flexible sigmoidoscopy or every 10 years for a colonoscopy beginning at age 57 and continuing until age 78.  Hepatitis C blood test.** / For all people born from [birth year] through 1965 and any individual with known risks for hepatitis C.  Skin self-exam. / Monthly.  Influenza vaccine. / Every year.  Tetanus, diphtheria, and acellular pertussis (Tdap/Td) vaccine.** / Consult your health care provider. 1 dose of Td every 10 years.  Varicella vaccine.** / Consult your health care provider.  Zoster vaccine.** / 1 dose for adults aged 64 years or older.  Measles, mumps, rubella (MMR) vaccine.** / You need at least 1 dose of MMR if you were born in 1957 or later. You may also need a second dose.  Pneumococcal 13-valent conjugate (PCV13) vaccine.** / Consult your health care provider.  Pneumococcal polysaccharide (PPSV23) vaccine.** / 1 to 2 doses if you smoke cigarettes or if you have certain conditions.  Meningococcal vaccine.** / Consult your health care provider.  Hepatitis A vaccine.** / Consult your health care provider.  Hepatitis B vaccine.** / Consult your health care provider.  Haemophilus influenzae type b (Hib) vaccine.** / Consult your health care provider. Ages 37 and over  Blood pressure check.** / Every year.  Lipid and cholesterol check.**/ Every 5 years beginning at age 66.  Lung cancer screening. / Every year if you are aged 42-80 years and have a 30-pack-year  history of smoking and currently smoke or have quit within the past 15 years. Yearly screening is stopped once you have quit smoking for at least 15 years or develop a health problem that would prevent you from having lung cancer treatment.  Fecal occult blood test (FOBT) of stool. / Every year beginning at age 24 and continuing until age 24. You may not have to do this test if you get a colonoscopy every 10 years.  Flexible sigmoidoscopy** or colonoscopy.** / Every 5 years for a flexible sigmoidoscopy or every 10 years for a colonoscopy beginning at age 18 and continuing until age 27.  Hepatitis C blood test.** / For all people born from [birth year] through 1963 and any individual with known risks for hepatitis C.  Abdominal aortic aneurysm (AAA) screening.** / A one-time screening for ages 52 to 70 years who are current or former smokers.  Skin self-exam. / Monthly.  Influenza vaccine. / Every year.  Tetanus, diphtheria, and acellular pertussis (Tdap/Td) vaccine.** / 1 dose of Td every 10 years.  Varicella vaccine.** / Consult your health care provider.  Zoster vaccine.** / 1 dose for adults aged 24 years or older.  Pneumococcal 13-valent conjugate (PCV13) vaccine.** / 1 dose for all adults aged 33 years and older.  Pneumococcal polysaccharide (PPSV23) vaccine.** / 1 dose for all adults aged 64 years and older.  Meningococcal vaccine.** / Consult your health care provider.  Hepatitis A vaccine.** / Consult your health care provider.  Hepatitis B vaccine.** / Consult your health care provider.  Haemophilus influenzae type b (Hib) vaccine.** / Consult your health care provider. **Family history and personal history of risk and conditions may change  your health care provider's recommendations.   This information is not intended to replace advice given to you by your health care provider. Make sure you discuss any questions you have with your health care provider.   Document Released:  10/17/2001 Document Revised: 09/11/2014 Document Reviewed: 01/16/2011 Elsevier Interactive Patient Education [employer].

## 2019-10-15 NOTE — Progress Notes (Signed)
Male physical  Impression and Recommendations:    1. Encounter for wellness examination   2. Health education/counseling   3. Need for Tdap vaccination      1) Anticipatory Guidance: Discussed importance of wearing a seatbelt while driving, not texting while driving;   sunscreen when outside along with skin surveillance; eating a balanced and modest diet; physical activity at least 25 minutes per day or 150 min/ week moderate to intense activity.  - Advised patient to perform self-testicular exams once per month.  2) Immunizations / Screenings / Labs:   All immunizations are up-to-date per recommendations or will be updated today. Patient is due for dental and vision screens which pt will schedule independently. Will obtain CBC, CMP, HgA1c, Lipid panel, TSH and vit D when fasting, if not already done recently.   -  Last fasting labs obtained August of 2020.  At that time, patient's cholesterol was improved from prior, and most other labs were improving and/or at goal, so no need for repeat labs today.  - TDAP up to date.  - Recommended follow-up with eye doctor yearly.  - Encouraged patient to establish with a dentist.  3) Weaver patient to continue working toward exercising to improve overall mental, physical, and emotional health.    - Reviewed the "spokes of the wheel" of mood and health management.  Stressed the importance of ongoing prudent habits, including regular exercise, appropriate sleep hygiene, healthful dietary habits, and prayer/meditation to relax.  - Encouraged patient to engage in daily physical activity, especially a formal exercise routine.  Recommended that the patient eventually strive for at least 150 minutes of moderate cardiovascular activity per week according to guidelines established by the [facility].   - Begin with goal of 30 minutes of exercise five days per week.  - Healthy dietary habits  encouraged, including low-carb, and high amounts of lean protein in diet.  Discussed goal of improving improving nutrient density of diet through increasing intake of fruits and vegetables and decreasing saturated fats, white flour products and refined sugars.   - Patient should also consume adequate amounts of water.  - Health counseling performed.  All questions answered.    Orders Placed This Encounter  Procedures  . Tdap vaccine greater than or equal to 7yo IM    Return for near future for chronic concerns/wrists as desired otherwise 6 months in August w FBW prior.  Reminded pt important of f-up preventative CPE in 1 year.  Reminded pt again, this is in addition to any chronic care visits.    Gross side effects, risk and benefits, and alternatives of medications discussed with patient.  Patient is aware that all medications have potential side effects and we are unable to predict every side effect or drug-drug interaction that may occur.  Expresses verbal understanding and consents to current therapy plan and treatment regimen.  Please see AVS handed out to patient at the end of our visit for further patient instructions/ counseling done pertaining to today's office visit.   This document serves as a record of services personally performed by Mellody Dance, DO. It was created on her behalf by Toni Amend, a trained medical scribe. The creation of this record is based on the scribe's personal observations and the provider's statements to them.   This case required medical decision making of at least moderate complexity.  This case required medical decision making of at least moderate complexity. The above  documentation from Toni Amend, medical scribe, has been reviewed by Marjory Sneddon, D.O.         Subjective:     I, Toni Amend, am serving as Education administrator for [employer].   CC: CPE   HPI: Raidyn Wassink is a 29 y.o. male who presents to [place] at [facility] today for a yearly health maintenance exam.     Health Maintenance Summary  - Reviewed and updated, unless pt declines services.  Tobacco History Reviewed:  Never smoker.  Alcohol / drug use:  No concerns, no use. Exercise Habits:  Not meeting AHA guidelines Dental Home:  Has not been to the dentist recently. Eye exams:  Last had an eye exam within the past year.  Has headaches every now and then but denies visual changes. Dermatology home:  Denies dermatological concerns.  Male history: STD concerns:  No current sexual partner.  Has been sexually active in the past, but does not have concerns regarding sexual health. Birth control method:  Not currently sexually active. Additional penile/ urinary concerns:  None reported.  Denies hernia, rashes, or concerns about penile discharge.  Additional concerns beyond Health Maintenance issues:   Concerns about his wrists which he thought office visit was for today and not his yearly physical, he will make a follow-up to discuss this in the near future at his convenience  Notes he's been careful during COVID.  Denies chest pain, SOB.  States "the only shortness of breath problems I have are that I don't exercise enough.  I shouldn't be out of breath, but I understand why I am.  Confirms that he eats three meals per day.  Notes he does not snack that much, and is satisfied with his weight.  Denies constipation, diarrhea, or bowel concerns.  Denies pain in the bladder.     Immunization History  Administered Date(s) Administered  . DTaP 06/06/1993, 09/22/1993, 03/05/1995, 01/14/1998  . Hepatitis A 09/24/2009, 08/31/2010  . Hepatitis B 01/08/1993, 06/06/1993, 05/02/1994  . HiB (PRP-OMP) 06/06/1993, 09/22/1993, 05/02/1994  . IPV 06/06/1993, 09/22/1993, 05/02/1994, 01/14/1998  . Influenza,inj,Quad PF,6+ Mos 09/01/2017, 09/04/2018, 06/15/2019  . Influenza-Unspecified 06/10/2016, 09/01/2017  . MMR  05/02/1994, 01/14/1998  . Meningococcal Conjugate 09/24/2009  . Tdap 09/24/2009     Health Maintenance  Topic Date Due  . HIV Screening  01/26/2008  . TETANUS/TDAP  09/25/2019  . INFLUENZA VACCINE  Completed       Wt Readings from Last 3 Encounters:  10/15/19 153 lb 1.6 oz (69.4 kg)  06/19/19 141 lb 3.2 oz (64 kg)  04/09/19 135 lb (61.2 kg)   BP Readings from Last 3 Encounters:  10/15/19 112/74  06/19/19 119/76  04/09/19 107/66   Pulse Readings from Last 3 Encounters:  10/15/19 (!) 101  06/19/19 99  04/09/19 88    Patient Active Problem List   Diagnosis Date Noted  . 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 Hyperlipidemia 03/13/2018  . Low glucose level/  Low A1c at 4.7 03/13/2018  . Low HDL (under 40) 03/13/2018  . Nausea without vomiting 07/05/2016  . Chronic headaches 04/03/2016  . Vitamin D deficiency 08/09/2017  . Acute reaction to situational stress 07/05/2016  . Sleep disorder 04/03/2016  . Pain in both feet 05/03/2019  . Popping of left temporomandibular joint on opening of jaw 05/03/2019  . Sensation of chest tightness- psychosomatic 05/03/2019  . GAD (generalized anxiety disorder) 11/18/2018  . Adult ADHD 10/08/2018  . Person with feared complaint, no diagnosis made  03/13/2018  . 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 Anxiety 08/08/2017  . Asthma 08/08/2017  . Constipation 08/08/2017  . Depressive disorder 08/08/2017  . Generalized muscle ache 08/07/2017  . Flatulence, eructation, and gas pain 07/05/2016  . Lipoma of forehead 04/03/2016  . 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 Family history of prostate cancer- MGF 04/03/2016  . Family history of depression in MOM, [facility] 04/03/2016  . Family history of suicide MGM 04/03/2016  . FH: migraines- multiple relatives 04/03/2016    Past Medical History:  Diagnosis Date  . Anxiety   . Asthma   . Attention deficit hyperactivity disorder (ADHD)  04/03/2016  . Bipolar 1 disorder, mixed ([place]) 04/03/2016   Dr Graylon Gunning- in [place], [place]  . Depression   . Sleep disorder 04/03/2016    Past Surgical History:  Procedure Laterality Date  . ESOPHAGOGASTRODUODENOSCOPY ENDOSCOPY    . WISDOM TOOTH EXTRACTION      Family History  Problem Relation Age of Onset  . Depression Mother   . Migraines Mother   . Migraines Brother   . Suicidality Maternal Grandmother   . Migraines Maternal Grandmother   . Cancer Maternal Grandfather        prostate  . Cancer Paternal Grandfather        unknown    Social History   Substance and Sexual Activity  Drug Use No  ,  Social History   Substance and Sexual Activity  Alcohol Use No  ,  Social History   Tobacco Use  Smoking Status Never Smoker  Smokeless Tobacco Never Used  ,  Social History   Substance and Sexual Activity  Sexual Activity Not Currently  . Birth control/protection: Condom    Patient's Medications  New Prescriptions   No medications on file  Previous Medications   CLONAZEPAM (KLONOPIN) 0.5 MG TABLET    Take 0.5 mg by mouth 2 (two) times daily as needed for anxiety.   ESCITALOPRAM (LEXAPRO) 20 MG TABLET    Take 1 tablet (20 mg total) by mouth daily.   LISDEXAMFETAMINE (VYVANSE) 40 MG CAPSULE    Take 1 capsule (40 mg total) by mouth daily.   MINOXIDIL PO    Take by mouth.   ONDANSETRON (ZOFRAN) 4 MG TABLET    Take 1 tablet (4 mg total) by mouth as needed for nausea or vomiting.   VITAMIN D, ERGOCALCIFEROL, (DRISDOL) 1.25 MG (50000 UT) CAPS CAPSULE    Take 1 capsule (50,000 Units total) by mouth every 7 (seven) days.  Modified Medications   No medications on file  Discontinued Medications   No medications on file    Adderall xr [amphetamine-dextroamphet er]  Review of Systems: General:   Denies fever, chills, unexplained weight loss.  Optho/Auditory:   Denies visual changes, blurred vision/LOV Respiratory:   Denies SOB, DOE more than baseline levels.     Cardiovascular:   Denies chest pain, palpitations, new onset peripheral edema  Gastrointestinal:   Denies nausea, vomiting, diarrhea.  Genitourinary: Denies dysuria, freq/ urgency, flank pain or discharge from genitals.  Endocrine:     Denies hot or cold intolerance, polyuria, polydipsia. Musculoskeletal:   Denies unexplained myalgias, joint swelling, unexplained arthralgias, gait problems.  Skin:  Denies rash, suspicious lesions Neurological:     Denies dizziness, unexplained weakness, numbness  Psychiatric/Behavioral:   Denies mood changes, suicidal or homicidal ideations, hallucinations    Objective:     Blood pressure 112/74, pulse (!) 101, temperature 97.7 F (36.5 C), temperature  source Oral, resp. rate 10, height 5' 9"  (1.753 m), weight 153 lb 1.6 oz (69.4 kg), SpO2 100 %. Body mass index is 22.61 kg/m. General Appearance:    Alert, cooperative, no distress, appears stated age  Head:    Normocephalic, without obvious abnormality, atraumatic  Eyes:    PERRL, conjunctiva/corneas clear, EOM's intact, fundi    benign, both eyes  Ears:    Normal TM's and external ear canals, both ears  Nose:   Nares normal, septum midline, mucosa normal, no drainage    or sinus tenderness  Throat:   Lips w/o lesion, mucosa moist, and tongue normal; teeth and   gums normal  Neck:   Supple, symmetrical, trachea midline, no adenopathy;    thyroid:  no enlargement/tenderness/nodules; no carotid   bruit or JVD  Back:     Symmetric, no curvature, ROM normal, no CVA tenderness  Lungs:     Clear to auscultation bilaterally, respirations unlabored, no       Wh/ R/ R  Chest Wall:    No tenderness or gross deformity; normal excursion   Heart:    Regular rate and rhythm, S1 and S2 normal, no murmur, rub   or gallop  Abdomen:     Soft, non-tender, bowel sounds active all four quadrants, NO   G/R/R, no masses, no organomegaly  Genitalia:    Ext genitalia: without lesion, no penile rash or discharge, no  hernias appreciated   Rectal:    Deferred.  Extremities:   Extremities normal, atraumatic, no cyanosis or gross edema  Pulses:   2+ and symmetric all extremities  Skin:   Warm, dry, Skin color, texture, turgor normal, no obvious rashes or lesions  M-Sk:   Ambulates * 4 w/o difficulty, no gross deformities, tone WNL  Neurologic:   CNII-XII intact, normal strength, sensation and reflexes    Throughout Psych:  No HI/SI, judgement and insight good, Euthymic mood. Full Affect.

## 2019-10-28 ENCOUNTER — Other Ambulatory Visit (HOSPITAL_COMMUNITY): Payer: Self-pay | Admitting: Psychiatry

## 2019-10-28 ENCOUNTER — Telehealth (HOSPITAL_COMMUNITY): Payer: Self-pay | Admitting: *Deleted

## 2019-10-28 MED ORDER — LISDEXAMFETAMINE DIMESYLATE 40 MG PO CAPS: 40.0000 mg | ORAL_CAPSULE | Freq: Every day | ORAL | 0 refills | Status: DC

## 2019-10-28 NOTE — Telephone Encounter (Signed)
Pt has called requesting refills on Lexapro and VyVanse. I will let pt know he has refills on Lexapro but he does need refill on the VyVanse. He has an upcoming appointment on 12/08/19. Please review.

## 2019-10-28 NOTE — Telephone Encounter (Signed)
Done

## 2019-11-27 ENCOUNTER — Telehealth (HOSPITAL_COMMUNITY): Payer: Self-pay

## 2019-11-27 ENCOUNTER — Other Ambulatory Visit (HOSPITAL_COMMUNITY): Payer: Self-pay | Admitting: Psychiatry

## 2019-11-27 MED ORDER — LISDEXAMFETAMINE DIMESYLATE 40 MG PO CAPS: 40.0000 mg | ORAL_CAPSULE | Freq: Every day | ORAL | 0 refills | Status: DC

## 2019-11-27 NOTE — Telephone Encounter (Signed)
Done

## 2019-11-27 NOTE — Telephone Encounter (Signed)
Patient called requesting a refill on his Vyvanse 40mg  to be sent to [facility] on [address] in [place]. He has scheduled appointment on 12/08/19. Thank you.

## 2019-12-03 NOTE — Telephone Encounter (Signed)
Had notified patient 

## 2019-12-08 ENCOUNTER — Ambulatory Visit (HOSPITAL_COMMUNITY): Payer: PRIVATE HEALTH INSURANCE | Admitting: Psychiatry

## 2019-12-08 ENCOUNTER — Other Ambulatory Visit: Payer: Self-pay

## 2019-12-23 ENCOUNTER — Other Ambulatory Visit (HOSPITAL_COMMUNITY): Payer: Self-pay | Admitting: Psychiatry

## 2019-12-23 ENCOUNTER — Telehealth (HOSPITAL_COMMUNITY): Payer: Self-pay | Admitting: *Deleted

## 2019-12-23 MED ORDER — LISDEXAMFETAMINE DIMESYLATE 40 MG PO CAPS: 40.0000 mg | ORAL_CAPSULE | Freq: Every day | ORAL | 0 refills | Status: DC

## 2019-12-23 MED ORDER — ESCITALOPRAM OXALATE 20 MG PO TABS: 20.0000 mg | ORAL_TABLET | Freq: Every day | ORAL | 2 refills | Status: DC

## 2019-12-23 NOTE — Telephone Encounter (Signed)
I reordered both but Vyvanse refill is due on 12/27/19

## 2019-12-23 NOTE — Telephone Encounter (Signed)
Pt called requesting refills on both VyVanse written 11/27/19 and Lexapro written on 09/25/19. With 2 refills. Pt now has an upcoming appointment on 12/25/19. Please review.

## 2019-12-25 ENCOUNTER — Telehealth (INDEPENDENT_AMBULATORY_CARE_PROVIDER_SITE_OTHER): Payer: BC Managed Care – PPO | Admitting: Psychiatry

## 2019-12-25 ENCOUNTER — Other Ambulatory Visit: Payer: Self-pay

## 2019-12-25 DIAGNOSIS — F909 Attention-deficit hyperactivity disorder, unspecified type: Secondary | ICD-10-CM | POA: Diagnosis not present

## 2019-12-25 DIAGNOSIS — F411 Generalized anxiety disorder: Secondary | ICD-10-CM

## 2019-12-25 MED ORDER — LISDEXAMFETAMINE DIMESYLATE 40 MG PO CAPS: 40.0000 mg | ORAL_CAPSULE | Freq: Every day | ORAL | 0 refills | Status: AC

## 2019-12-25 NOTE — Progress Notes (Signed)
[facility] MD/PA/NP OP Progress Note  12/25/2019 3:08 PM Derak Fenstermaker  MRN:  [id number] Interview was conducted by phone and I verified that I was speaking with the correct person using two identifiers. I discussed the limitations of evaluation and management by telemedicine and  the availability of in person appointments. Patient expressed understanding and agreed to proceed.  Chief Complaint: "I am doing fine".  HPI: 29yo single male with long hx ofADD, depression and mixed anxiety/somatic symptom disorder.He had been also dx with having bipolar disorder but I have doubts if he indeed has one as he is unable to clearly describe any distinct manic/hypomanic episodes. Poor response to Adderall (tachycardia) fair to low dose of Vyvanse. He has never been on antidepressants for depression/anxiety. He hasa hx ofepisodic passive SI, none at this time. Sleep fair(with 25 mg of Benadryl), appetite normal. He admittedly worries a lot about his health(hypochondriasis)although he has no chronic medical conditions.His anxiety appear to have subsided and he has not been using clonazepam much - still tablets from Rx written 10 months ago. He has also obsessive tendenciesand possibly compulsions. His tendency to  ruminate over many things has decreased with increase of Lexapro dose.He tolerates 40 mg of Vyvanse well. Anxietyof the panic typepractically resolved.  Visit Diagnosis:    ICD-10-CM   1. Adult ADHD  F90.9   2. GAD (generalized anxiety disorder)  F41.1     Past Psychiatric History: Please see intake H&P.  Past Medical History:  Past Medical History:  Diagnosis Date  . Anxiety   . Asthma   . Attention deficit hyperactivity disorder (ADHD) 04/03/2016  . Bipolar 1 disorder, mixed ([place]) 04/03/2016   Dr Graylon Gunning- in [place], [place]  . Depression   . Sleep disorder 04/03/2016    Past Surgical History:  Procedure Laterality Date  . ESOPHAGOGASTRODUODENOSCOPY ENDOSCOPY    . WISDOM TOOTH EXTRACTION       Family Psychiatric History: Reviewed.  Family History:  Family History  Problem Relation Age of Onset  . Depression Mother   . Migraines Mother   . Migraines Brother   . Suicidality Maternal Grandmother   . Migraines Maternal Grandmother   . Cancer Maternal Grandfather        prostate  . Cancer Paternal Grandfather        unknown    Social History:  Social History   Socioeconomic History  . Marital status: Single    Spouse name: Not on file  . Number of children: Not on file  . Years of education: Not on file  . Highest education level: Not on file  Occupational History  . Not on file  Tobacco Use  . Smoking status: Never Smoker  . Smokeless tobacco: Never Used  Substance and Sexual Activity  . Alcohol use: No  . Drug use: No  . Sexual activity: Not Currently    Birth control/protection: Condom  Other Topics Concern  . Not on file  Social History Narrative  . Not on file   Social Determinants of Health   Financial Resource Strain:   . Difficulty of Paying Living Expenses:   Food Insecurity:   . Worried About Charity fundraiser in the Last Year:   . Arboriculturist in the Last Year:   Transportation Needs:   . Film/video editor (Medical):   Marland Kitchen Lack of Transportation (Non-Medical):   Physical Activity:   . Days of Exercise per Week:   . Minutes of Exercise per Session:  Stress:   . Feeling of Stress :   Social Connections:   . Frequency of Communication with Friends and Family:   . Frequency of Social Gatherings with Friends and Family:   . Attends Religious Services:   . Active Member of Clubs or Organizations:   . Attends Archivist Meetings:   Marland Kitchen Marital Status:     Allergies:  Allergies  Allergen Reactions  . Adderall Xr [Amphetamine-Dextroamphet Er]     Rapid heart rate, panic feeling, increased anxiety     Metabolic Disorder Labs: Lab Results  Component Value Date   HGBA1C 5.0 04/09/2019   No results found for:  PROLACTIN Lab Results  Component Value Date   CHOL 181 04/09/2019   TRIG 105 04/09/2019   HDL 54 04/09/2019   CHOLHDL 3.4 04/09/2019   LDLCALC 106 (H) 04/09/2019   LDLCALC 119 (H) 03/13/2018   Lab Results  Component Value Date   TSH 0.635 04/09/2019   TSH 0.995 08/07/2017    Therapeutic Level Labs: No results found for: LITHIUM No results found for: VALPROATE No components found for:  CBMZ  Current Medications: Current Outpatient Medications  Medication Sig Dispense Refill  . clonazePAM (KLONOPIN) 0.5 MG tablet Take 0.5 mg by mouth 2 (two) times daily as needed for anxiety.    Marland Kitchen escitalopram (LEXAPRO) 20 MG tablet Take 1 tablet (20 mg total) by mouth daily. 30 tablet 2  . [START ON 12/27/2019] lisdexamfetamine (VYVANSE) 40 MG capsule Take 1 capsule (40 mg total) by mouth daily. 30 capsule 0  . [START ON 01/26/2020] lisdexamfetamine (VYVANSE) 40 MG capsule Take 1 capsule (40 mg total) by mouth daily before breakfast. 30 capsule 0  . [START ON 02/26/2020] lisdexamfetamine (VYVANSE) 40 MG capsule Take 1 capsule (40 mg total) by mouth daily before breakfast. 30 capsule 0  . [START ON 03/27/2020] lisdexamfetamine (VYVANSE) 40 MG capsule Take 1 capsule (40 mg total) by mouth daily before breakfast. 30 capsule 0  . MINOXIDIL PO Take by mouth.    . ondansetron (ZOFRAN) 4 MG tablet Take 1 tablet (4 mg total) by mouth as needed for nausea or vomiting. 30 tablet 0  . Vitamin D, Ergocalciferol, (DRISDOL) 1.25 MG (50000 UT) CAPS capsule Take 1 capsule (50,000 Units total) by mouth every 7 (seven) days. (Patient not taking: Reported on 10/15/2019) 12 capsule 3   No current facility-administered medications for this visit.    Psychiatric Specialty Exam: Review of Systems  Psychiatric/Behavioral: The patient is nervous/anxious.   All other systems reviewed and are negative.   There were no vitals taken for this visit.There is no height or weight on file to calculate BMI.  General Appearance: NA   Eye Contact:  NA  Speech:  Clear and Coherent and Normal Rate  Volume:  Normal  Mood:  Minimally anxious.  Affect:  NA  Thought Process:  Goal Directed and Linear  Orientation:  Full (Time, Place, and Person)  Thought Content: He tends to ruminate.   Suicidal Thoughts:  No  Homicidal Thoughts:  No  Memory:  Immediate;   Good Recent;   Good Remote;   Good  Judgement:  Good  Insight:  Fair  Psychomotor Activity:  NA  Concentration:  Concentration: Good  Recall:  Good  Fund of Knowledge: Good  Language: Good  Akathisia:  Negative  Handed:  Right  AIMS (if indicated): not done  Assets:  Desire for Improvement Housing Physical Health  ADL's:  Intact  Cognition: WNL  Sleep:  Good   Screenings: GAD-7     Office Visit from 10/15/2019 in [place] at [facility] Visit from 04/09/2019 in [place] at [facility] Visit from 11/07/2018 in [place] at [facility]  Total GAD-7 Score  1  1  12     PHQ2-9     Office Visit from 10/15/2019 in [place] at [facility] Visit from 06/19/2019 in [place] at [facility] Visit from 04/09/2019 in [place] at [facility] Visit from 11/07/2018 in [place] at [facility] Visit from 10/08/2018 in [place] at [facility]  PHQ-2 Total Score  2  1  0  4  2  PHQ-9 Total Score  9  3  0  16  9       Assessment and Plan: 29yo single male with long hx ofADD, depression and mixed anxiety/somatic symptom disorder.He had been also dx with having bipolar disorder but I have doubts if he indeed has one as he is unable to clearly describe any distinct manic/hypomanic episodes. Poor response to Adderall (tachycardia) fair to low dose of Vyvanse. He has never been on antidepressants for depression/anxiety. He hasa hx ofepisodic passive SI, none at this time. Sleep fair(with 25 mg of Benadryl), appetite  normal. He admittedly worries a lot about his health(hypochondriasis)although he has no chronic medical conditions.His anxiety appear to have subsided and he has not been using clonazepam much - still tablets from Rx written 10 months ago. He has also obsessive tendenciesand possibly compulsions. His tendency to  ruminate over many things has decreased with increase of Lexapro dose.He tolerates 40 mg of Vyvanse well. Anxietyof the panic typepractically resolved.   Dx: Adult ADHD; GAD  Plan:ContinueLexapro 20mg andVyvanse 40 mg. Wille Glaser stated that he does not need a refill onclonazepam 0.5 mg prn anxietyshould he require one. Next visitinapproximately 3-19months.He prefers to call at that time to make an appointment. The plan was discussed with patient who had an opportunity to ask questions and these were all answered. I spend15 minutes inphone consultation with the patient.    Stephanie Acre, MD 12/25/2019, 3:08 PM

## 2020-03-25 ENCOUNTER — Telehealth (HOSPITAL_COMMUNITY): Payer: Self-pay

## 2020-03-25 NOTE — Telephone Encounter (Signed)
Patient called requesting a refill on his Lexapro 20mg . He uses [facility] on [address] in [place]. No future followup scheduled as of yet. Thank you.

## 2020-03-26 ENCOUNTER — Other Ambulatory Visit (HOSPITAL_COMMUNITY): Payer: Self-pay | Admitting: Psychiatry

## 2020-03-26 MED ORDER — ESCITALOPRAM OXALATE 20 MG PO TABS: 20.0000 mg | ORAL_TABLET | Freq: Every day | ORAL | 2 refills | Status: AC

## 2020-03-26 NOTE — Telephone Encounter (Signed)
Notified patient.

## 2020-03-26 NOTE — Telephone Encounter (Signed)
Done

## 2020-03-30 DIAGNOSIS — F902 Attention-deficit hyperactivity disorder, combined type: Secondary | ICD-10-CM | POA: Diagnosis not present

## 2020-03-30 DIAGNOSIS — F419 Anxiety disorder, unspecified: Secondary | ICD-10-CM | POA: Diagnosis not present

## 2020-03-30 DIAGNOSIS — Z79899 Other long term (current) drug therapy: Secondary | ICD-10-CM | POA: Diagnosis not present

## 2020-03-30 DIAGNOSIS — R41840 Attention and concentration deficit: Secondary | ICD-10-CM | POA: Diagnosis not present

## 2020-04-12 ENCOUNTER — Other Ambulatory Visit: Payer: Self-pay

## 2020-04-12 ENCOUNTER — Other Ambulatory Visit: Payer: BC Managed Care – PPO

## 2020-04-12 DIAGNOSIS — Z0000 Encounter for general adult medical examination without abnormal findings: Secondary | ICD-10-CM | POA: Diagnosis not present

## 2020-04-12 DIAGNOSIS — R7309 Other abnormal glucose: Secondary | ICD-10-CM

## 2020-04-12 DIAGNOSIS — E162 Hypoglycemia, unspecified: Secondary | ICD-10-CM | POA: Diagnosis not present

## 2020-04-12 DIAGNOSIS — E785 Hyperlipidemia, unspecified: Secondary | ICD-10-CM

## 2020-04-12 DIAGNOSIS — E559 Vitamin D deficiency, unspecified: Secondary | ICD-10-CM

## 2020-04-13 LAB — LIPID PANEL
Chol/HDL Ratio: 4.5 ratio (ref 0.0–5.0)
Cholesterol, Total: 203 mg/dL — ABNORMAL HIGH (ref 100–199)
HDL: 45 mg/dL (ref >39)
LDL Chol Calc (NIH): 135 mg/dL — ABNORMAL HIGH (ref 0–99)
Triglycerides: 129 mg/dL (ref 0–149)
VLDL Cholesterol Cal: 23 mg/dL (ref 5–40)

## 2020-04-13 LAB — COMPREHENSIVE METABOLIC PANEL
ALT: 15 IU/L (ref 0–44)
AST: 14 IU/L (ref 0–40)
Albumin/Globulin Ratio: 1.6 (ref 1.2–2.2)
Albumin: 4.6 g/dL (ref 4.1–5.2)
Alkaline Phosphatase: 88 IU/L (ref 48–121)
BUN/Creatinine Ratio: 11 (ref 9–20)
BUN: 13 mg/dL (ref 6–20)
Bilirubin Total: 0.9 mg/dL (ref 0.0–1.2)
CO2: 23 mmol/L (ref 20–29)
Calcium: 9.7 mg/dL (ref 8.7–10.2)
Chloride: 102 mmol/L (ref 96–106)
Creatinine, Ser: 1.15 mg/dL (ref 0.76–1.27)
GFR calc Af Amer: 100 mL/min/{1.73_m2} (ref >59)
GFR calc non Af Amer: 87 mL/min/{1.73_m2} (ref >59)
Globulin, Total: 2.8 g/dL (ref 1.5–4.5)
Glucose: 90 mg/dL (ref 65–99)
Potassium: 4.2 mmol/L (ref 3.5–5.2)
Sodium: 141 mmol/L (ref 134–144)
Total Protein: 7.4 g/dL (ref 6.0–8.5)

## 2020-04-13 LAB — CBC WITH DIFFERENTIAL/PLATELET
Basophils Absolute: 0.1 10*3/uL (ref 0.0–0.2)
Basos: 1 %
EOS (ABSOLUTE): 0.1 10*3/uL (ref 0.0–0.4)
Eos: 1 %
Hematocrit: 48.3 % (ref 37.5–51.0)
Hemoglobin: 16.9 g/dL (ref 13.0–17.7)
Immature Grans (Abs): 0 10*3/uL (ref 0.0–0.1)
Immature Granulocytes: 0 %
Lymphocytes Absolute: 2.7 10*3/uL (ref 0.7–3.1)
Lymphs: 47 %
MCH: 31 pg (ref 26.6–33.0)
MCHC: 35 g/dL (ref 31.5–35.7)
MCV: 89 fL (ref 79–97)
Monocytes Absolute: 0.5 10*3/uL (ref 0.1–0.9)
Monocytes: 8 %
Neutrophils Absolute: 2.6 10*3/uL (ref 1.4–7.0)
Neutrophils: 43 %
Platelets: 248 10*3/uL (ref 150–450)
RBC: 5.46 x10E6/uL (ref 4.14–5.80)
RDW: 12.7 % (ref 11.6–15.4)
WBC: 5.9 10*3/uL (ref 3.4–10.8)

## 2020-04-13 LAB — TSH: TSH: 1.03 u[IU]/mL (ref 0.450–4.500)

## 2020-04-13 LAB — HEMOGLOBIN A1C
Est. average glucose Bld gHb Est-mCnc: 94 mg/dL
Hgb A1c MFr Bld: 4.9 % (ref 4.8–5.6)

## 2020-04-13 LAB — VITAMIN D 25 HYDROXY (VIT D DEFICIENCY, FRACTURES): Vit D, 25-Hydroxy: 27.1 ng/mL — ABNORMAL LOW (ref 30.0–100.0)

## 2020-04-15 ENCOUNTER — Ambulatory Visit (INDEPENDENT_AMBULATORY_CARE_PROVIDER_SITE_OTHER): Payer: BC Managed Care – PPO | Admitting: Physician Assistant

## 2020-04-15 ENCOUNTER — Encounter: Payer: Self-pay | Admitting: Physician Assistant

## 2020-04-15 ENCOUNTER — Other Ambulatory Visit: Payer: Self-pay

## 2020-04-15 VITALS — BP 100/64 | HR 81 | Temp 97.4°F | Ht 69.0 in | Wt 150.3 lb

## 2020-04-15 DIAGNOSIS — F329 Major depressive disorder, single episode, unspecified: Secondary | ICD-10-CM | POA: Diagnosis not present

## 2020-04-15 DIAGNOSIS — F900 Attention-deficit hyperactivity disorder, predominantly inattentive type: Secondary | ICD-10-CM

## 2020-04-15 DIAGNOSIS — F32A Depression, unspecified: Secondary | ICD-10-CM

## 2020-04-15 DIAGNOSIS — E785 Hyperlipidemia, unspecified: Secondary | ICD-10-CM | POA: Diagnosis not present

## 2020-04-15 DIAGNOSIS — E559 Vitamin D deficiency, unspecified: Secondary | ICD-10-CM

## 2020-04-15 DIAGNOSIS — F419 Anxiety disorder, unspecified: Secondary | ICD-10-CM

## 2020-04-15 NOTE — Assessment & Plan Note (Signed)
-  Most recent Vit D 27.1 -Advised patient to start taking OTC Vit D3 2000 units daily. -Will continue to monitor and recheck at CPE.

## 2020-04-15 NOTE — Progress Notes (Signed)
Established Patient Office Visit  Subjective:  Patient ID: Brent Brooks, male    DOB: [birth year]  Age: 29 y.o. MRN: [id number]  CC:  Chief Complaint  Patient presents with  . Anxiety  . Depression    HPI Brent Brooks presents for mood and lab discussion. Pt has no acute concerns today. He is followed by Psychiatry for ADHD, anxiety, and depression. When questioned about his PHQ-9 results he states he has chronic passive ideations but has never thought about a plan. States since starting Lexapro his anxiety has been better.  Vitamin D deficiency: Pt reports he hasn't been taking vitamin d supplement.  HLD: Pt states he is quite sedentary and his diet consists of red meat and fried foods.   Past Medical History:  Diagnosis Date  . Anxiety   . Asthma   . Attention deficit hyperactivity disorder (ADHD) 04/03/2016  . Bipolar 1 disorder, mixed ([place]) 04/03/2016   Dr Graylon Gunning- in [place], [place]  . Depression   . Sleep disorder 04/03/2016    Past Surgical History:  Procedure Laterality Date  . ESOPHAGOGASTRODUODENOSCOPY ENDOSCOPY    . WISDOM TOOTH EXTRACTION      Family History  Problem Relation Age of Onset  . Depression Mother   . Migraines Mother   . Migraines Brother   . Suicidality Maternal Grandmother   . Migraines Maternal Grandmother   . Cancer Maternal Grandfather        prostate  . Cancer Paternal Grandfather        unknown    Social History   Socioeconomic History  . Marital status: Single    Spouse name: Not on file  . Number of children: Not on file  . Years of education: Not on file  . Highest education level: Not on file  Occupational History  . Not on file  Tobacco Use  . Smoking status: Never Smoker  . Smokeless tobacco: Never Used  Vaping Use  . Vaping Use: Never used  Substance and Sexual Activity  . Alcohol use: No  . Drug use: No  . Sexual activity: Not Currently    Birth control/protection: Condom  Other Topics Concern  . Not on  file  Social History Narrative  . Not on file   Social Determinants of Health   Financial Resource Strain:   . Difficulty of Paying Living Expenses:   Food Insecurity:   . Worried About Charity fundraiser in the Last Year:   . Arboriculturist in the Last Year:   Transportation Needs:   . Film/video editor (Medical):   Marland Kitchen Lack of Transportation (Non-Medical):   Physical Activity:   . Days of Exercise per Week:   . Minutes of Exercise per Session:   Stress:   . Feeling of Stress :   Social Connections:   . Frequency of Communication with Friends and Family:   . Frequency of Social Gatherings with Friends and Family:   . Attends Religious Services:   . Active Member of Clubs or Organizations:   . Attends Archivist Meetings:   Marland Kitchen Marital Status:   Intimate Partner Violence:   . Fear of Current or Ex-Partner:   . Emotionally Abused:   Marland Kitchen Physically Abused:   . Sexually Abused:     Outpatient Medications Prior to Visit  Medication Sig Dispense Refill  . clonazePAM (KLONOPIN) 0.5 MG tablet Take 0.5 mg by mouth 2 (two) times daily as needed for anxiety.    Marland Kitchen  escitalopram (LEXAPRO) 20 MG tablet Take 1 tablet (20 mg total) by mouth daily. 30 tablet 2  . lisdexamfetamine (VYVANSE) 40 MG capsule Take 1 capsule (40 mg total) by mouth daily before breakfast. 30 capsule 0  . MINOXIDIL PO Take by mouth.    . ondansetron (ZOFRAN) 4 MG tablet Take 1 tablet (4 mg total) by mouth as needed for nausea or vomiting. 30 tablet 0  . lisdexamfetamine (VYVANSE) 40 MG capsule Take 1 capsule (40 mg total) by mouth daily before breakfast. 30 capsule 0  . lisdexamfetamine (VYVANSE) 40 MG capsule Take 1 capsule (40 mg total) by mouth daily before breakfast. 30 capsule 0  . Vitamin D, Ergocalciferol, (DRISDOL) 1.25 MG (50000 UT) CAPS capsule Take 1 capsule (50,000 Units total) by mouth every 7 (seven) days. (Patient not taking: Reported on 10/15/2019) 12 capsule 3  . lisdexamfetamine (VYVANSE)  40 MG capsule Take 1 capsule (40 mg total) by mouth daily. 30 capsule 0   No facility-administered medications prior to visit.    Allergies  Allergen Reactions  . Adderall Xr [Amphetamine-Dextroamphet Er]     Rapid heart rate, panic feeling, increased anxiety     ROS Review of Systems  A fourteen system review of systems was performed and found to be positive as per HPI.  Objective:    Physical Exam General:  Well Developed, well nourished, patient avoids eye contact during visit  Neuro:  Alert and oriented,  extra-ocular muscles intact  HEENT:  Normocephalic, atraumatic, neck supple Skin:  no gross rash, warm, pink. Cardiac:  RRR, S1 S2 Respiratory:  ECTA B/L and A/P, Not using accessory muscles, speaking in full sentences- unlabored. Vascular:  Ext warm, no cyanosis apprec.; cap RF less 2 sec. No gross edema Psych:   Judgement and insight good, Euthymic mood. Flat Affect.  BP 100/64   Pulse 81   Temp (!) 97.4 F (36.3 C) (Oral)   Ht 5\' 9"  (1.753 m)   Wt 150 lb 4.8 oz (68.2 kg)   SpO2 100%   BMI 22.20 kg/m  Wt Readings from Last 3 Encounters:  04/15/20 150 lb 4.8 oz (68.2 kg)  10/15/19 153 lb 1.6 oz (69.4 kg)  06/19/19 141 lb 3.2 oz (64 kg)     Health Maintenance Due  Topic Date Due  . INFLUENZA VACCINE  04/04/2020    There are no preventive care reminders to display for this patient.  Lab Results  Component Value Date   TSH 1.030 04/12/2020   Lab Results  Component Value Date   WBC 5.9 04/12/2020   HGB 16.9 04/12/2020   HCT 48.3 04/12/2020   MCV 89 04/12/2020   PLT 248 04/12/2020   Lab Results  Component Value Date   NA 141 04/12/2020   K 4.2 04/12/2020   CO2 23 04/12/2020   GLUCOSE 90 04/12/2020   BUN 13 04/12/2020   CREATININE 1.15 04/12/2020   BILITOT 0.9 04/12/2020   ALKPHOS 88 04/12/2020   AST 14 04/12/2020   ALT 15 04/12/2020   PROT 7.4 04/12/2020   ALBUMIN 4.6 04/12/2020   CALCIUM 9.7 04/12/2020   Lab Results  Component Value  Date   CHOL 203 (H) 04/12/2020   Lab Results  Component Value Date   HDL 45 04/12/2020   Lab Results  Component Value Date   LDLCALC 135 (H) 04/12/2020   Lab Results  Component Value Date   TRIG 129 04/12/2020   Lab Results  Component Value Date   CHOLHDL 4.5  04/12/2020   Lab Results  Component Value Date   HGBA1C 4.9 04/12/2020      Assessment & Plan:   Problem List Items Addressed This Visit      Other   Attention deficit hyperactivity disorder, predominantly inattentive type    -Followed by Psychiatry. -Continue current medication regimen.      Vitamin D deficiency    -Most recent Vit D 27.1 -Advised patient to start taking OTC Vit D3 2000 units daily. -Will continue to monitor and recheck at CPE.      Anxiety    -GAD-7 score of 5 -Followed by Psychiatry. -Continue current medication regimen. -Encouraged to incorporate nonpharmacologic therapy such as exercise and relaxation techniques.       Depressive disorder    -PHQ-9 score of 9 -Advised patient to discuss with psychiatry chronic passive ideations. Currently denies SI/HI. -Continue current medication regimen. -Discussed with patient seeking immediate medical care/help if develops SI/HI with active plan. Pt verbalized understanding.       Hyperlipidemia - Primary    - Most recent lipid panel: total cholesterol and LDL elevated - Recommended to reduce fried foods and red meat, increase lean meats, and increase physical activity. - Will continue to monitor and recheck at CPE.            No orders of the defined types were placed in this encounter.   Follow-up: Return in about 6 months (around 10/16/2020) for CPE and FBW.   Note:  This note was prepared with assistance of Dragon voice recognition software. Occasional wrong-word or sound-a-like substitutions may have occurred due to the inherent limitations of voice recognition software.   Lorrene Reid, PA-C

## 2020-04-15 NOTE — Assessment & Plan Note (Signed)
-  Followed by Psychiatry. -Continue current medication regimen.

## 2020-04-15 NOTE — Assessment & Plan Note (Signed)
-   Most recent lipid panel: total cholesterol and LDL elevated - Recommended to reduce fried foods and red meat, increase lean meats, and increase physical activity. - Will continue to monitor and recheck at CPE.

## 2020-04-15 NOTE — Assessment & Plan Note (Addendum)
-  PHQ-9 score of 9 -Advised patient to discuss with psychiatry chronic passive ideations. Currently denies SI/HI. -Continue current medication regimen. -Discussed with patient seeking immediate medical care/help if develops SI/HI with active plan. Pt verbalized understanding.

## 2020-04-15 NOTE — Assessment & Plan Note (Addendum)
-  GAD-7 score of 5 -Followed by Psychiatry. -Continue current medication regimen. -Encouraged to incorporate nonpharmacologic therapy such as exercise and relaxation techniques.

## 2020-06-01 IMAGING — CR DG WRIST COMPLETE 3+V*R*
4 series · 4 of 4 positions shown · non-contrast
Comparison: None.

CLINICAL DATA: Pain on forced extension

EXAM:
RIGHT WRIST - COMPLETE 3+ VIEW

[x wrist pa right]
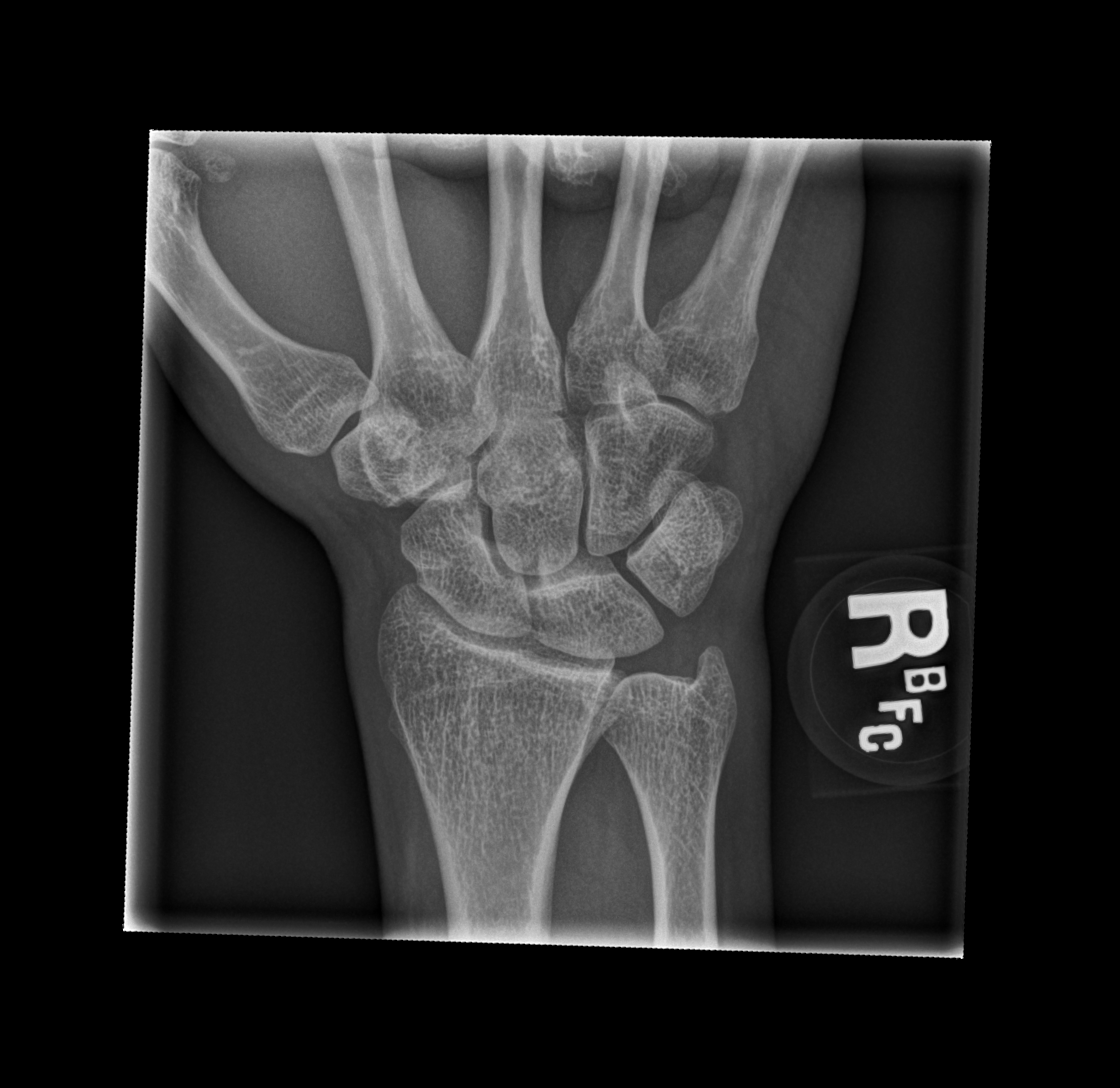

[x wrist obl right]
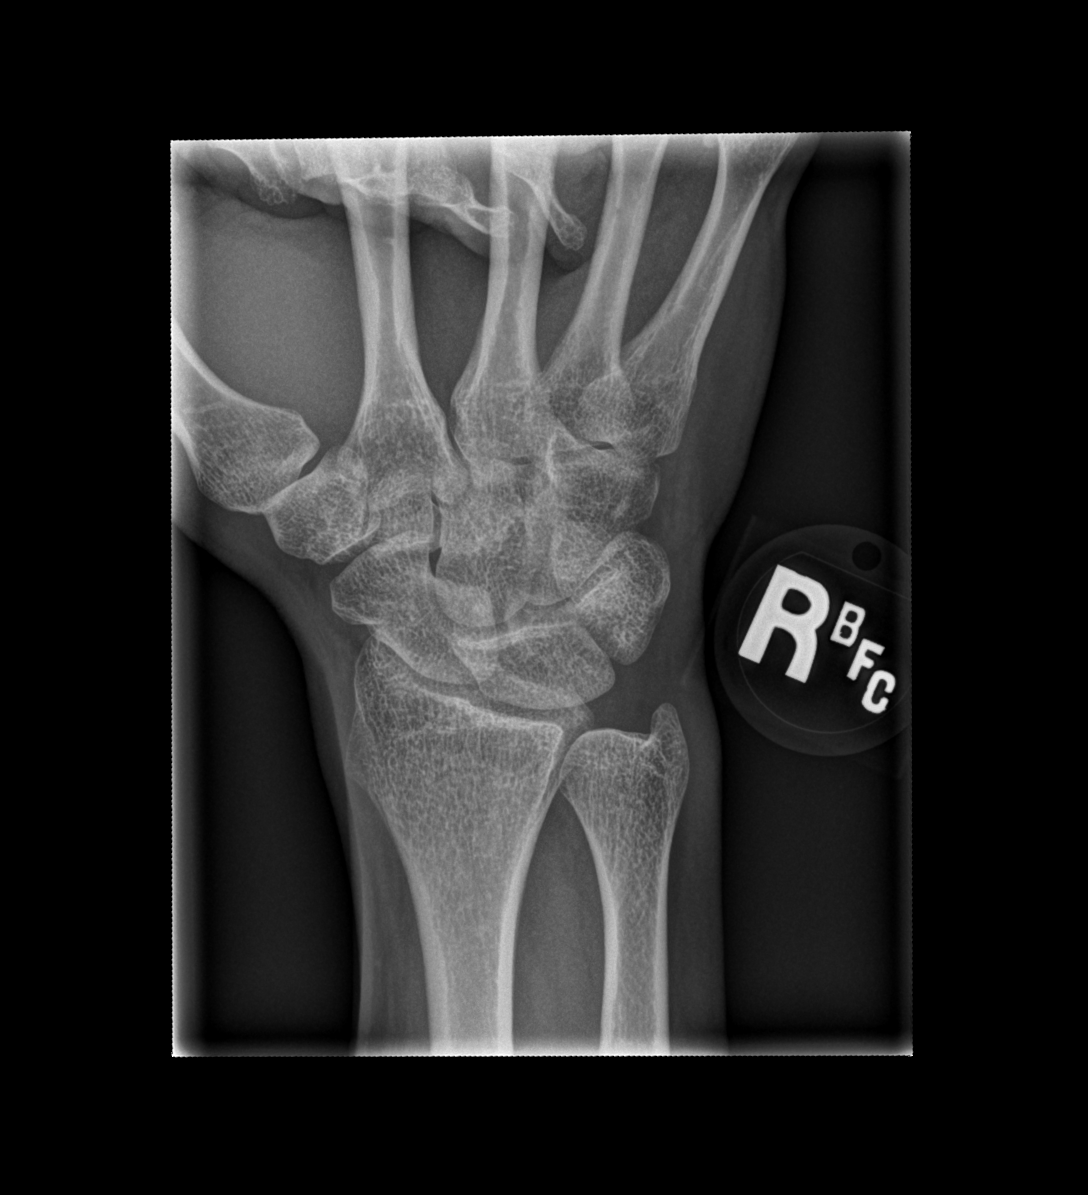

[x wrist lat right]
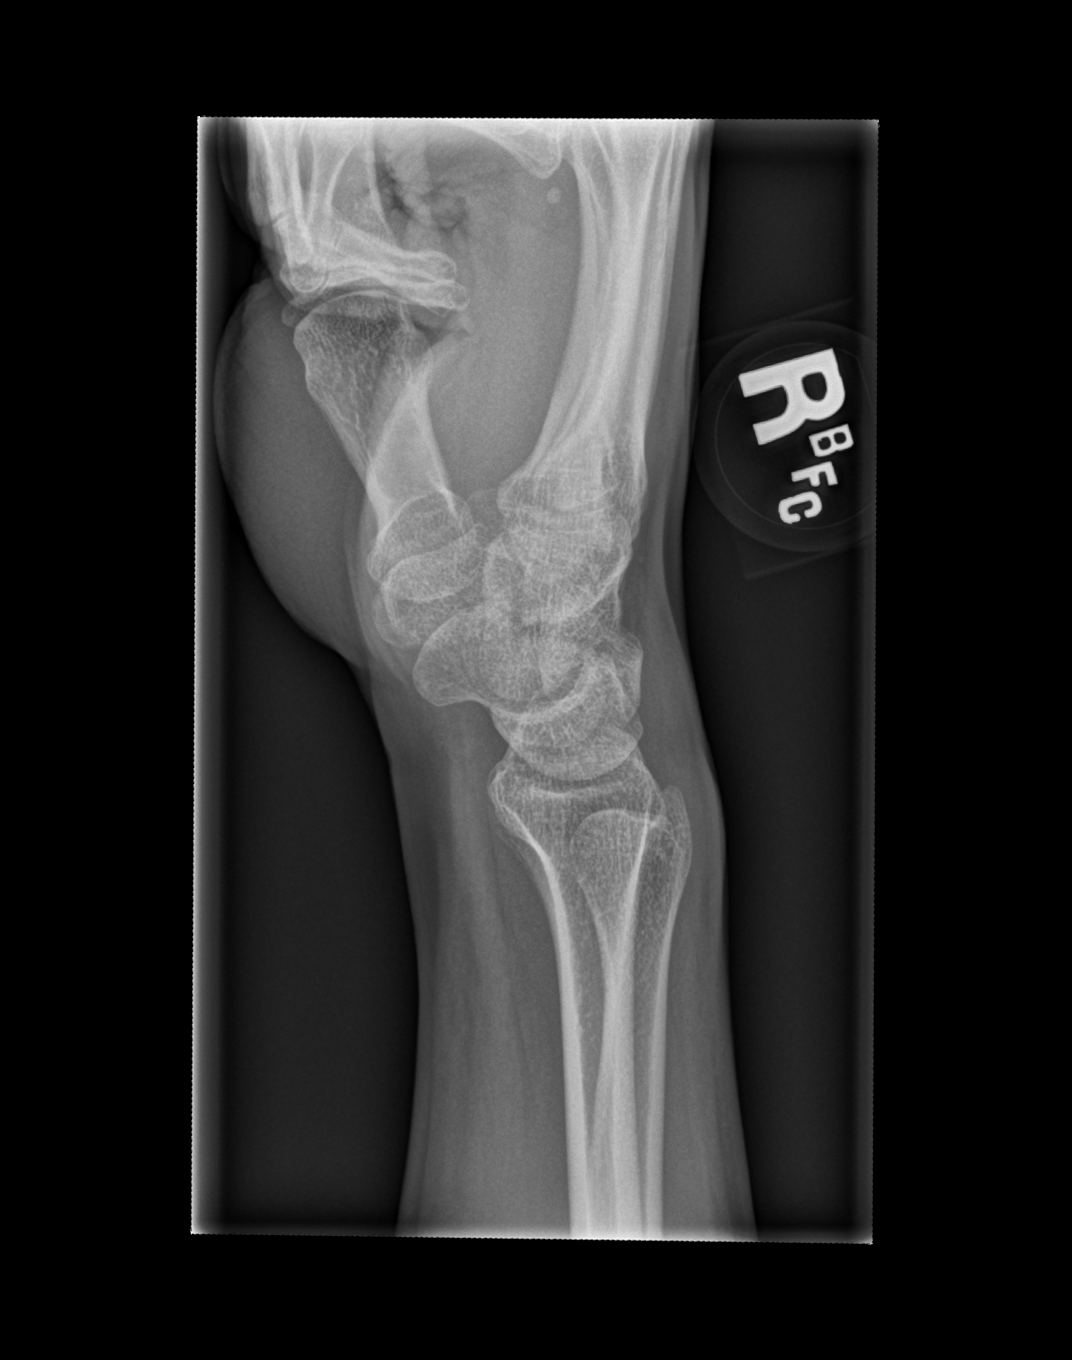

[x wrist navicular view right]
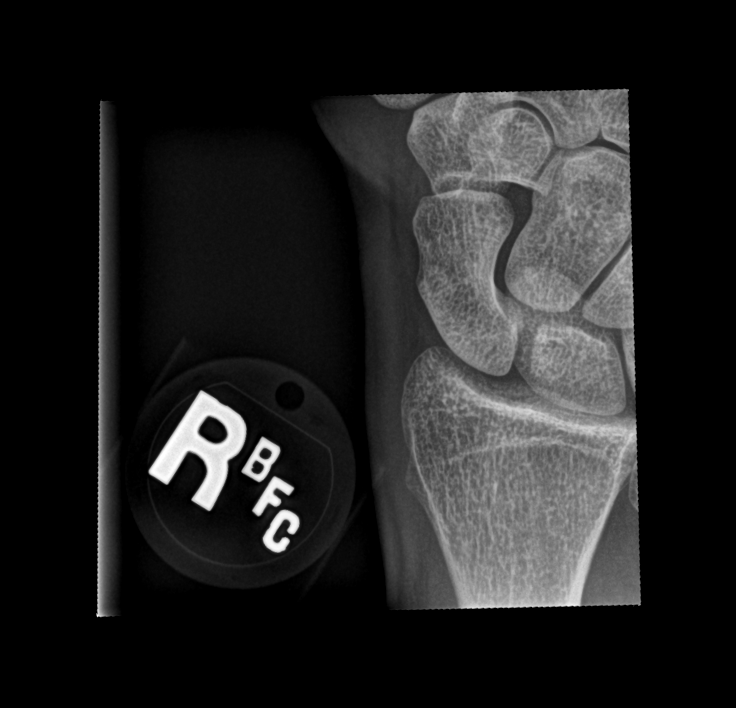

[4 of 4 positions shown; findings below may reference images not displayed]

FINDINGS: There is no evidence of fracture or dislocation. There is no
evidence of arthropathy or other focal bone abnormality. Soft
tissues are unremarkable.
IMPRESSION: Negative.

## 2020-06-28 DIAGNOSIS — Z79899 Other long term (current) drug therapy: Secondary | ICD-10-CM | POA: Diagnosis not present

## 2020-06-28 DIAGNOSIS — F419 Anxiety disorder, unspecified: Secondary | ICD-10-CM | POA: Diagnosis not present

## 2020-06-28 DIAGNOSIS — F331 Major depressive disorder, recurrent, moderate: Secondary | ICD-10-CM | POA: Diagnosis not present

## 2020-06-28 DIAGNOSIS — F902 Attention-deficit hyperactivity disorder, combined type: Secondary | ICD-10-CM | POA: Diagnosis not present

## 2020-09-28 DIAGNOSIS — F902 Attention-deficit hyperactivity disorder, combined type: Secondary | ICD-10-CM | POA: Diagnosis not present

## 2020-09-28 DIAGNOSIS — F419 Anxiety disorder, unspecified: Secondary | ICD-10-CM | POA: Diagnosis not present

## 2020-09-28 DIAGNOSIS — F331 Major depressive disorder, recurrent, moderate: Secondary | ICD-10-CM | POA: Diagnosis not present

## 2020-09-28 DIAGNOSIS — Z79899 Other long term (current) drug therapy: Secondary | ICD-10-CM | POA: Diagnosis not present

## 2020-10-15 ENCOUNTER — Encounter: Payer: BC Managed Care – PPO | Admitting: Physician Assistant

## 2020-12-13 DIAGNOSIS — Z79899 Other long term (current) drug therapy: Secondary | ICD-10-CM | POA: Diagnosis not present

## 2020-12-13 DIAGNOSIS — F902 Attention-deficit hyperactivity disorder, combined type: Secondary | ICD-10-CM | POA: Diagnosis not present

## 2021-03-02 DIAGNOSIS — S61001A Unspecified open wound of right thumb without damage to nail, initial encounter: Secondary | ICD-10-CM | POA: Diagnosis not present

## 2021-03-11 ENCOUNTER — Other Ambulatory Visit: Payer: Self-pay

## 2021-03-11 DIAGNOSIS — Z131 Encounter for screening for diabetes mellitus: Secondary | ICD-10-CM

## 2021-03-11 DIAGNOSIS — Z1329 Encounter for screening for other suspected endocrine disorder: Secondary | ICD-10-CM

## 2021-03-11 DIAGNOSIS — Z136 Encounter for screening for cardiovascular disorders: Secondary | ICD-10-CM

## 2021-03-11 DIAGNOSIS — E785 Hyperlipidemia, unspecified: Secondary | ICD-10-CM

## 2021-03-11 DIAGNOSIS — E559 Vitamin D deficiency, unspecified: Secondary | ICD-10-CM

## 2021-03-14 ENCOUNTER — Other Ambulatory Visit: Payer: BC Managed Care – PPO

## 2021-03-14 ENCOUNTER — Other Ambulatory Visit: Payer: Self-pay

## 2021-03-14 DIAGNOSIS — S61001D Unspecified open wound of right thumb without damage to nail, subsequent encounter: Secondary | ICD-10-CM | POA: Diagnosis not present

## 2021-03-14 DIAGNOSIS — L089 Local infection of the skin and subcutaneous tissue, unspecified: Secondary | ICD-10-CM | POA: Diagnosis not present

## 2021-03-14 DIAGNOSIS — Z131 Encounter for screening for diabetes mellitus: Secondary | ICD-10-CM

## 2021-03-14 DIAGNOSIS — E785 Hyperlipidemia, unspecified: Secondary | ICD-10-CM

## 2021-03-14 DIAGNOSIS — E559 Vitamin D deficiency, unspecified: Secondary | ICD-10-CM

## 2021-03-14 DIAGNOSIS — Z136 Encounter for screening for cardiovascular disorders: Secondary | ICD-10-CM

## 2021-03-14 DIAGNOSIS — Z1329 Encounter for screening for other suspected endocrine disorder: Secondary | ICD-10-CM

## 2021-03-15 LAB — CBC WITH DIFFERENTIAL/PLATELET
Basophils Absolute: 0.1 10*3/uL (ref 0.0–0.2)
Basos: 1 %
EOS (ABSOLUTE): 0.1 10*3/uL (ref 0.0–0.4)
Eos: 1 %
Hematocrit: 52.1 % — ABNORMAL HIGH (ref 37.5–51.0)
Hemoglobin: 17.5 g/dL (ref 13.0–17.7)
Immature Grans (Abs): 0 10*3/uL (ref 0.0–0.1)
Immature Granulocytes: 0 %
Lymphocytes Absolute: 3.9 10*3/uL — ABNORMAL HIGH (ref 0.7–3.1)
Lymphs: 44 %
MCH: 29.9 pg (ref 26.6–33.0)
MCHC: 33.6 g/dL (ref 31.5–35.7)
MCV: 89 fL (ref 79–97)
Monocytes Absolute: 0.6 10*3/uL (ref 0.1–0.9)
Monocytes: 7 %
Neutrophils Absolute: 4.1 10*3/uL (ref 1.4–7.0)
Neutrophils: 47 %
Platelets: 262 10*3/uL (ref 150–450)
RBC: 5.86 x10E6/uL — ABNORMAL HIGH (ref 4.14–5.80)
RDW: 13 % (ref 11.6–15.4)
WBC: 8.8 10*3/uL (ref 3.4–10.8)

## 2021-03-15 LAB — COMPREHENSIVE METABOLIC PANEL
ALT: 11 IU/L (ref 0–44)
AST: 18 IU/L (ref 0–40)
Albumin/Globulin Ratio: 1.9 (ref 1.2–2.2)
Albumin: 4.9 g/dL (ref 4.1–5.2)
Alkaline Phosphatase: 92 IU/L (ref 44–121)
BUN/Creatinine Ratio: 10 (ref 9–20)
BUN: 11 mg/dL (ref 6–20)
Bilirubin Total: 0.6 mg/dL (ref 0.0–1.2)
CO2: 21 mmol/L (ref 20–29)
Calcium: 9.9 mg/dL (ref 8.7–10.2)
Chloride: 98 mmol/L (ref 96–106)
Creatinine, Ser: 1.13 mg/dL (ref 0.76–1.27)
Globulin, Total: 2.6 g/dL (ref 1.5–4.5)
Glucose: 84 mg/dL (ref 65–99)
Potassium: 3.9 mmol/L (ref 3.5–5.2)
Sodium: 139 mmol/L (ref 134–144)
Total Protein: 7.5 g/dL (ref 6.0–8.5)
eGFR: 91 mL/min/{1.73_m2} (ref >59)

## 2021-03-15 LAB — LIPID PANEL
Chol/HDL Ratio: 4.7 ratio (ref 0.0–5.0)
Cholesterol, Total: 183 mg/dL (ref 100–199)
HDL: 39 mg/dL — ABNORMAL LOW (ref >39)
LDL Chol Calc (NIH): 120 mg/dL — ABNORMAL HIGH (ref 0–99)
Triglycerides: 134 mg/dL (ref 0–149)
VLDL Cholesterol Cal: 24 mg/dL (ref 5–40)

## 2021-03-15 LAB — VITAMIN D 25 HYDROXY (VIT D DEFICIENCY, FRACTURES): Vit D, 25-Hydroxy: 32.8 ng/mL (ref 30.0–100.0)

## 2021-03-15 LAB — HEMOGLOBIN A1C
Est. average glucose Bld gHb Est-mCnc: 97 mg/dL
Hgb A1c MFr Bld: 5 % (ref 4.8–5.6)

## 2021-03-15 LAB — TSH: TSH: 1.18 u[IU]/mL (ref 0.450–4.500)

## 2021-03-16 ENCOUNTER — Encounter: Payer: BC Managed Care – PPO | Admitting: Physician Assistant

## 2021-03-16 DIAGNOSIS — Z79899 Other long term (current) drug therapy: Secondary | ICD-10-CM | POA: Diagnosis not present

## 2021-03-16 DIAGNOSIS — F902 Attention-deficit hyperactivity disorder, combined type: Secondary | ICD-10-CM | POA: Diagnosis not present

## 2021-06-16 DIAGNOSIS — F419 Anxiety disorder, unspecified: Secondary | ICD-10-CM | POA: Diagnosis not present

## 2021-06-16 DIAGNOSIS — Z79899 Other long term (current) drug therapy: Secondary | ICD-10-CM | POA: Diagnosis not present

## 2021-06-16 DIAGNOSIS — F902 Attention-deficit hyperactivity disorder, combined type: Secondary | ICD-10-CM | POA: Diagnosis not present

## 2021-09-14 DIAGNOSIS — Z79899 Other long term (current) drug therapy: Secondary | ICD-10-CM | POA: Diagnosis not present

## 2021-09-14 DIAGNOSIS — F419 Anxiety disorder, unspecified: Secondary | ICD-10-CM | POA: Diagnosis not present

## 2021-09-14 DIAGNOSIS — F902 Attention-deficit hyperactivity disorder, combined type: Secondary | ICD-10-CM | POA: Diagnosis not present

## 2021-12-13 DIAGNOSIS — Z79899 Other long term (current) drug therapy: Secondary | ICD-10-CM | POA: Diagnosis not present

## 2021-12-13 DIAGNOSIS — F902 Attention-deficit hyperactivity disorder, combined type: Secondary | ICD-10-CM | POA: Diagnosis not present

## 2021-12-13 DIAGNOSIS — F419 Anxiety disorder, unspecified: Secondary | ICD-10-CM | POA: Diagnosis not present

## 2022-05-05 DIAGNOSIS — F902 Attention-deficit hyperactivity disorder, combined type: Secondary | ICD-10-CM | POA: Diagnosis not present

## 2022-05-05 DIAGNOSIS — Z79899 Other long term (current) drug therapy: Secondary | ICD-10-CM | POA: Diagnosis not present

## 2022-05-05 DIAGNOSIS — F419 Anxiety disorder, unspecified: Secondary | ICD-10-CM | POA: Diagnosis not present
